# Patient Record
Sex: Male | Born: 1950 | Race: White | Hispanic: No | Marital: Married | Smoking: Former smoker
Health system: Southern US, Community
[De-identification: ages and names within clinical notes are randomized; demographics above are authoritative.]

## PROBLEM LIST (undated history)

## (undated) DIAGNOSIS — I359 Nonrheumatic aortic valve disorder, unspecified: Secondary | ICD-10-CM

## (undated) DIAGNOSIS — R635 Abnormal weight gain: Secondary | ICD-10-CM

## (undated) DIAGNOSIS — R002 Palpitations: Secondary | ICD-10-CM

## (undated) DIAGNOSIS — K5732 Diverticulitis of large intestine without perforation or abscess without bleeding: Secondary | ICD-10-CM

## (undated) DIAGNOSIS — Z0000 Encounter for general adult medical examination without abnormal findings: Secondary | ICD-10-CM

## (undated) DIAGNOSIS — R001 Bradycardia, unspecified: Secondary | ICD-10-CM

## (undated) DIAGNOSIS — C439 Malignant melanoma of skin, unspecified: Secondary | ICD-10-CM

## (undated) DIAGNOSIS — K635 Polyp of colon: Secondary | ICD-10-CM

## (undated) DIAGNOSIS — J310 Chronic rhinitis: Secondary | ICD-10-CM

## (undated) DIAGNOSIS — I099 Rheumatic heart disease, unspecified: Secondary | ICD-10-CM

## (undated) HISTORY — DX: Rheumatic heart disease, unspecified: I09.9

## (undated) HISTORY — PX: COLONOSCOPY: SHX174

## (undated) HISTORY — DX: Diverticulitis of large intestine without perforation or abscess without bleeding: K57.32

## (undated) HISTORY — PX: HERNIA REPAIR: SHX51

## (undated) HISTORY — DX: Abnormal weight gain: R63.5

## (undated) HISTORY — PX: OTHER SURGICAL HISTORY: SHX169

## (undated) HISTORY — PX: TONSILLECTOMY: SUR1361

## (undated) HISTORY — DX: Malignant melanoma of skin, unspecified: C43.9

## (undated) HISTORY — DX: Nonrheumatic aortic valve disorder, unspecified: I35.9

## (undated) HISTORY — DX: Chronic rhinitis: J31.0

## (undated) HISTORY — DX: Polyp of colon: K63.5

## (undated) HISTORY — DX: Palpitations: R00.2

## (undated) HISTORY — DX: Encounter for general adult medical examination without abnormal findings: Z00.00

## (undated) HISTORY — DX: Bradycardia, unspecified: R00.1

---

## 2000-05-30 ENCOUNTER — Other Ambulatory Visit: Admission: RE | Admit: 2000-05-30 | Discharge: 2000-05-30 | Payer: Self-pay | Admitting: Orthopaedic Surgery

## 2002-03-06 ENCOUNTER — Ambulatory Visit (HOSPITAL_BASED_OUTPATIENT_CLINIC_OR_DEPARTMENT_OTHER): Admission: RE | Admit: 2002-03-06 | Discharge: 2002-03-06 | Payer: Self-pay | Admitting: General Surgery

## 2002-03-06 ENCOUNTER — Encounter (INDEPENDENT_AMBULATORY_CARE_PROVIDER_SITE_OTHER): Payer: Self-pay | Admitting: Specialist

## 2004-10-26 ENCOUNTER — Ambulatory Visit: Payer: Self-pay | Admitting: Internal Medicine

## 2006-05-09 ENCOUNTER — Ambulatory Visit: Payer: Self-pay | Admitting: Internal Medicine

## 2006-05-24 ENCOUNTER — Ambulatory Visit: Payer: Self-pay

## 2006-05-24 ENCOUNTER — Encounter: Payer: Self-pay | Admitting: Cardiology

## 2006-06-20 ENCOUNTER — Ambulatory Visit: Payer: Self-pay | Admitting: Internal Medicine

## 2006-08-27 ENCOUNTER — Ambulatory Visit: Payer: Self-pay | Admitting: Internal Medicine

## 2006-09-04 ENCOUNTER — Ambulatory Visit: Payer: Self-pay | Admitting: Internal Medicine

## 2007-04-01 ENCOUNTER — Ambulatory Visit: Payer: Self-pay | Admitting: Internal Medicine

## 2007-06-27 ENCOUNTER — Ambulatory Visit: Payer: Self-pay | Admitting: Internal Medicine

## 2007-06-27 LAB — CONVERTED CEMR LAB
ALT: 25 units/L (ref 0–53)
BUN: 11 mg/dL (ref 6–23)
Bilirubin Urine: NEGATIVE
CO2: 31 meq/L (ref 19–32)
CRP, High Sensitivity: <1 — ABNORMAL LOW (ref 0.00–5.00)
Calcium: 9.2 mg/dL (ref 8.4–10.5)
Creatinine, Ser: 0.8 mg/dL (ref 0.4–1.5)
GFR calc Af Amer: 130 mL/min
Glucose, Bld: 91 mg/dL (ref 70–99)
HCT: 44.9 % (ref 39.0–52.0)
Hemoglobin, Urine: NEGATIVE
Leukocytes, UA: NEGATIVE
Lymphocytes Relative: 22.9 % (ref 12.0–46.0)
MCHC: 34.9 g/dL (ref 30.0–36.0)
MCV: 89 fL (ref 78.0–100.0)
Monocytes Absolute: 0.5 10*3/uL (ref 0.2–0.7)
Monocytes Relative: 7.3 % (ref 3.0–11.0)
Neutro Abs: 4.4 10*3/uL (ref 1.4–7.7)
Nitrite: NEGATIVE
PSA: 1.68 ng/mL (ref 0.10–4.00)
Potassium: 4 meq/L (ref 3.5–5.1)
RDW: 12.6 % (ref 11.5–14.6)
Sodium: 141 meq/L (ref 135–145)
Specific Gravity, Urine: >=1.03 (ref 1.000–1.03)
TSH: 1.39 microintl units/mL (ref 0.35–5.50)
Total Protein, Urine: NEGATIVE mg/dL
Triglycerides: 143 mg/dL (ref 0–149)
Urobilinogen, UA: 0.2 (ref 0.0–1.0)
pH: 6 (ref 5.0–8.0)

## 2007-11-01 DIAGNOSIS — D239 Other benign neoplasm of skin, unspecified: Secondary | ICD-10-CM | POA: Insufficient documentation

## 2007-11-01 DIAGNOSIS — J310 Chronic rhinitis: Secondary | ICD-10-CM

## 2007-11-01 DIAGNOSIS — D126 Benign neoplasm of colon, unspecified: Secondary | ICD-10-CM

## 2007-11-01 DIAGNOSIS — I359 Nonrheumatic aortic valve disorder, unspecified: Secondary | ICD-10-CM

## 2007-11-01 DIAGNOSIS — K5730 Diverticulosis of large intestine without perforation or abscess without bleeding: Secondary | ICD-10-CM | POA: Insufficient documentation

## 2008-04-01 ENCOUNTER — Ambulatory Visit: Payer: Self-pay

## 2008-04-01 ENCOUNTER — Encounter: Payer: Self-pay | Admitting: Internal Medicine

## 2008-04-01 ENCOUNTER — Ambulatory Visit: Payer: Self-pay | Admitting: Internal Medicine

## 2008-08-07 ENCOUNTER — Ambulatory Visit: Payer: Self-pay | Admitting: Internal Medicine

## 2008-08-07 DIAGNOSIS — E7800 Pure hypercholesterolemia, unspecified: Secondary | ICD-10-CM

## 2008-08-07 LAB — CONVERTED CEMR LAB
AST: 19 units/L (ref 0–37)
Albumin: 4.1 g/dL (ref 3.5–5.2)
BUN: 17 mg/dL (ref 6–23)
Basophils Relative: 0.4 % (ref 0.0–3.0)
CRP, High Sensitivity: 1 (ref 0.00–5.00)
Cholesterol: 194 mg/dL (ref 0–200)
Creatinine, Ser: 0.9 mg/dL (ref 0.4–1.5)
Eosinophils Relative: 1 % (ref 0.0–5.0)
GFR calc Af Amer: 113 mL/min
GFR calc non Af Amer: 93 mL/min
Glucose, Bld: 100 mg/dL — ABNORMAL HIGH (ref 70–99)
HDL: 39.9 mg/dL (ref >39.0)
LDL Cholesterol: 136 mg/dL — ABNORMAL HIGH (ref 0–99)
Lymphocytes Relative: 22.2 % (ref 12.0–46.0)
Monocytes Relative: 7.2 % (ref 3.0–12.0)
Neutrophils Relative %: 69.2 % (ref 43.0–77.0)
PSA: 1.51 ng/mL (ref 0.10–4.00)
Platelets: 158 10*3/uL (ref 150–400)
RDW: 12.5 % (ref 11.5–14.6)
Total Bilirubin: 1.2 mg/dL (ref 0.3–1.2)
Triglycerides: 93 mg/dL (ref 0–149)
VLDL: 19 mg/dL (ref 0–40)

## 2008-08-20 ENCOUNTER — Telehealth (INDEPENDENT_AMBULATORY_CARE_PROVIDER_SITE_OTHER): Payer: Self-pay | Admitting: *Deleted

## 2008-09-09 ENCOUNTER — Telehealth (INDEPENDENT_AMBULATORY_CARE_PROVIDER_SITE_OTHER): Payer: Self-pay | Admitting: *Deleted

## 2009-05-04 ENCOUNTER — Ambulatory Visit: Payer: Self-pay | Admitting: Internal Medicine

## 2009-08-09 ENCOUNTER — Ambulatory Visit: Payer: Self-pay | Admitting: Internal Medicine

## 2009-08-09 DIAGNOSIS — R51 Headache: Secondary | ICD-10-CM

## 2009-08-09 DIAGNOSIS — R519 Headache, unspecified: Secondary | ICD-10-CM | POA: Insufficient documentation

## 2009-08-11 LAB — CONVERTED CEMR LAB
ALT: 25 units/L (ref 0–53)
Albumin: 4.2 g/dL (ref 3.5–5.2)
BUN: 16 mg/dL (ref 6–23)
Basophils Relative: 0.8 % (ref 0.0–3.0)
CRP, High Sensitivity: 4.3 (ref 0.00–5.00)
Cholesterol: 224 mg/dL — ABNORMAL HIGH (ref 0–200)
Creatinine, Ser: 1 mg/dL (ref 0.4–1.5)
GFR calc non Af Amer: 81.93 mL/min (ref >60)
HDL: 44.4 mg/dL (ref >39.00)
Leukocytes, UA: NEGATIVE
Lymphs Abs: 1.5 10*3/uL (ref 0.7–4.0)
MCHC: 35.2 g/dL (ref 30.0–36.0)
MCV: 91.2 fL (ref 78.0–100.0)
Monocytes Absolute: 0.4 10*3/uL (ref 0.1–1.0)
Monocytes Relative: 5.9 % (ref 3.0–12.0)
Neutrophils Relative %: 70.7 % (ref 43.0–77.0)
PSA: 1.82 ng/mL (ref 0.10–4.00)
Potassium: 4.2 meq/L (ref 3.5–5.1)
RBC: 5.23 M/uL (ref 4.22–5.81)
Sodium: 143 meq/L (ref 135–145)
Specific Gravity, Urine: 1.025 (ref 1.000–1.030)
Total Bilirubin: 1.5 mg/dL — ABNORMAL HIGH (ref 0.3–1.2)
Total Protein, Urine: NEGATIVE mg/dL
Total Protein: 7.3 g/dL (ref 6.0–8.3)
Triglycerides: 137 mg/dL (ref 0.0–149.0)
Urobilinogen, UA: 0.2 (ref 0.0–1.0)
WBC: 7 10*3/uL (ref 4.5–10.5)
pH: 5.5 (ref 5.0–8.0)

## 2009-08-13 ENCOUNTER — Telehealth: Payer: Self-pay | Admitting: Internal Medicine

## 2009-11-05 ENCOUNTER — Ambulatory Visit: Payer: Self-pay | Admitting: Internal Medicine

## 2009-11-18 ENCOUNTER — Ambulatory Visit: Payer: Self-pay | Admitting: Internal Medicine

## 2009-11-18 DIAGNOSIS — R05 Cough: Secondary | ICD-10-CM

## 2010-01-17 ENCOUNTER — Encounter: Payer: Self-pay | Admitting: Internal Medicine

## 2010-11-03 NOTE — Assessment & Plan Note (Signed)
Summary: Primary svc/ acute uri   Primary Provider/Referring Provider:  Sherene Sires  CC:  Acute visit.  Pt c/o dry ocugh x 3 days.  Also he c/o chills and achiness.  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  no new neuro co's or n or v.   better with advil  November 05, 2009 acute ov Flew to [facility] Jan 30 and back early am on 2/1 with acute onset aching all over and painful cough then chills better with advil,  this am nasal slt yellow and sore throat this am.  no sob.  Pt denies any significant sore throat, dysphagia, itching, sneezing,  nasal congestion or excess secretions,  fever, chills, sweats, unintended wt loss, lateralizing  pleuritic or exertional cp, hempoptysis, change in activity tolerance  orthopnea pnd or leg swelling   Current Medications (verified): 1)  Flonase 50 Mcg/act  Susp (Fluticasone Propionate) .... Take 1 Spray in Each Nostirl Two Times A Day 2)  Multivitamins   Tabs (Multiple Vitamin) .... Once Daily 3)  Adult Aspirin Low Strength 81 Mg  Tbdp (Aspirin) .... Once Daily 4)  Fish Oil Concentrate 1000 Mg  Caps (Omega-3 Fatty Acids) .... Four Times A Day 5)  Viagra 50 Mg  Tabs (Sildenafil Citrate) .... One Daily As Needed  Allergies (verified): No Known Drug Allergies  Past History:  Past Medical History: RHINITIS, CHRONIC (ICD-472.0) Hx of DIVERTICULITIS, COLON (ICD-562.11)............................Leon Gibson Medoff Hx of POLYP, COLON (ICD-211.3)        - Colonoscopy  8/05 AORTIC INSUFFICIENCY (ICD-424.1)........................................Leon Gibson     - Echo 04/01/08  Melanoma..................................................................................Leon Gibson      -  L  paralumbar 2003, no adjuvant rx Weight Gain       -   ideal < 191  Target wt  =  219  for BMI < 30  HEALTH MAINTENANCE..............................................................Leon Gibson   - Td August 07, 2008    -  CPX  August 07, 2008   History of rheumatic heart disease  Vital Signs:  Patient profile:   60 year old male Weight:      208 pounds O2 Sat:      100 % on Room air Temp:     97.6 degrees F oral Pulse rate:   50 / minute BP sitting:   140 / 58  (left arm)  Vitals Entered By: Vernie Murders (November 05, 2009 1:56 PM)  O2 Flow:  Room air  Physical Exam  Additional Exam:  wt 201 August 07, 2008 > 204 August 11, 2009 > 208 November 05, 2009  Ambulatory healthy appearing wm in no acute distress. HEENT: nl dentition, turbinates, and orophanx. Nl external ear canals without cough reflex Neck without JVD/Nodes/TM Lungs clear to A and P bilaterally without cough on insp or exp maneuvers RRR II/VI  diastolic m Abd soft and benign with nl excursion in the supine position. No bruits or organomegaly Ext warm without calf tenderness, cyanosis clubbing or edema Skin warm and dry with multiple slt aytypical nevi over trunk    Impression & Recommendations:  Problem # 1:  RHINITIS, CHRONIC (ICD-472.0)  Acute flare with tracheobronchitis, prob uri related to air  travel  See instructions for specific recommendations   Explained natural h/o uri and why it's necessary in patients at risk to rx short term with PPI to reduce risk of evolving cyclical cough triggered by epithelial injury and a heightened sensitivty to the effects of any upper airway irritants,  most importantly acid - related    Orders: Est. Patient Level III ([address])  Medications Added to Medication List This Visit: 1)  Prednisone 10 Mg Tabs (Prednisone) .... 4 each am x 2days, 2x2days, 1x2days and stop 2)  Doxycycline Hyclate 100 Mg Caps (Doxycycline hyclate) .... One twice daily for 7 days taken before  meals with a glass of water  Patient Instructions: 1)  GERD (REFLUX)  is a common cause of respiratory symptoms. It commonly presents without heartburn and can be treated with medication, but also with lifestyle changes including avoidance of late meals, excessive alcohol, smoking cessation, and avoid fatty foods, chocolate, peppermint, colas, red wine, and acidic juices such as orange juice. NO MINT OR MENTHOL PRODUCTS SO NO COUGH DROPS  2)  USE SUGARLESS CANDY INSTEAD (jolley ranchers)  3)  NO OIL BASED VITAMINS  4)  Prilosec otc 20mg  Take  one 30-60 min before first meal of the day and pecid 20 mg one at bedtime as long as cough or sore throat 5)  Doxycyline 100mg  one twice daily  6)  Prednisone 10 mg 4 each am x 2days, 2x2days, 1x2days and stop  Prescriptions: DOXYCYCLINE HYCLATE 100 MG CAPS (DOXYCYCLINE HYCLATE) one twice daily for 7 days taken before meals with a glass of water  #14 x 0   Entered and Authorized by:   Nyoka Cowden MD   Signed by:   Nyoka Cowden MD on 11/05/2009   Method used:   Electronically to        [facility] (retail)       [address]       [place], [place]  [address]       Ph: [phone] or [phone]       Fax: [phone]   RxID:   [id number] PREDNISONE 10 MG  TABS (PREDNISONE) 4 each am x 2days, 2x2days, 1x2days and stop  #14 x 0   Entered and Authorized by:   Nyoka Cowden MD   Signed by:   Nyoka Cowden MD on 11/05/2009   Method used:   Electronically to        [facility] (retail)       [address]       [place], [place]  [id number]       Ph: [phone] or [phone]       Fax: [phone]   RxID:   [id number]

## 2010-11-03 NOTE — Assessment & Plan Note (Signed)
Summary: Primary svc/ eval persistent cough   Primary Provider/Referring Provider:  Sherene Sires  CC:  Acute visit.  Pt c/o increased cough x 2 days.  Cough is non prod.  He also c/o increased nasal congestion x 2 days.Marland Kitchen  History of Present Illness: 69  yowm minimal smoking as teenager  with AI known since 1999 followed by [name] with h/o palpitations with coffee  August 07, 2008 cpx ex = tennis, walking, no sob, cp, palpitations, tia or claudication symptoms  August 09, 2009  cpx,  occ ha with scotoma all his life, somewhat worse lately with stree re daughter's depression.  no new neuro co's or n or v.   better with advil  November 05, 2009 acute ov airplane travel  to [facility] Jan 30 and back early am on 2/2  with acute onset aching all over same day as returned  and painful cough then chills better with advil,  this am nasal slt yellow and sore throat this am.   rec Prilosec otc 20mg  Take  one 30-60 min before first meal of the day and pecid 20 mg one at bedtime as long as cough or sore throat Doxycyline 100mg  one twice daily  Prednisone 10 mg 4 each am x 2days, 2x2days, 1x2days and stop > almost completely then relapsed 2/16 with bad cough  November 18, 2009 Acute visit.  Pt c/o increased cough x 2 days.  Cough is non prod.  He also c/o increased nasal congestion x 2 days. Minimal white mucus.  not consistent with gerd rx. Pt denies any significant sore throat, dysphagia, itching, sneezing, excess nasal secretions,  fever, chills, sweats, unintended wt loss, pleuritic or exertional cp, hempoptysis, change in activity tolerance  orthopnea pnd or leg swelling.  Current Medications (verified): 1)  Flonase 50 Mcg/act  Susp (Fluticasone Propionate) .... Take 1 Spray in Each Nostirl Two Times A Day 2)  Multivitamins   Tabs (Multiple Vitamin) .... Once Daily 3)  Adult Aspirin Low Strength 81 Mg  Tbdp (Aspirin) .... Once Daily 4)  Fish Oil Concentrate 1000 Mg  Caps (Omega-3 Fatty Acids) .... Four  Times A Day 5)  Viagra 50 Mg  Tabs (Sildenafil Citrate) .... One Daily As Needed  Allergies (verified): No Known Drug Allergies  Past History:  Past Medical History: RHINITIS, CHRONIC (ICD-472.0) Hx of DIVERTICULITIS, COLON (ICD-562.11)............................Marland Kitchen Medoff Hx of POLYP, COLON (ICD-211.3)        - Colonoscopy  8/05 AORTIC INSUFFICIENCY (ICD-424.1)........................................Marland KitchenLadona Ridgel     - Echo 04/01/08  Melanoma..................................................................................Marland KitchenLomax      -  L paralumbar 2003, no adjuvant rx Weight Gain       -   ideal < 191  Target wt  =  219  for BMI < 30  HEALTH MAINTENANCE................................................................Marland KitchenWert   - Td August 07, 2008    -  CPX  August 07, 2008   History of rheumatic heart disease  Vital Signs:  Patient profile:   60 year old male Weight:      203 pounds O2 Sat:      97 % on Room air Temp:     97.9 degrees F oral Pulse rate:   82 / minute BP sitting:   130 / 64  (left arm)  Vitals Entered By: Vernie Murders (November 18, 2009 10:14 AM)  O2 Flow:  Room air  Physical Exam  Additional Exam:  wt 201 August 07, 2008 > 204 August 11, 2009 > 208 November 05, 2009 > 203 November 18, 2009  Ambulatory healthy appearing wm in no acute distress with frequent throat clearing HEENT: nl dentition, turbinates, and orophanx. Nl external ear canals without cough reflex Neck without JVD/Nodes/TM Lungs clear to A and P bilaterally without cough on insp or exp maneuvers RRR II/VI  diastolic m Abd soft and benign with nl excursion in the supine position. No bruits or organomegaly Ext warm without calf tenderness, cyanosis clubbing or edema     CXR  Procedure date:  11/18/2009  Findings:      Comparison: Chest 10/26/2004.   Findings: The lungs are clear.  No pleural effusion.  Heart size normal.   IMPRESSION: No Acute disease.  Stable compared to  prior exam.  Impression & Recommendations:  Problem # 1:  COUGH (ICD-786.2) Of the three most common causes of chronic cough, only one GERD  can actually cause the other two and perpetuate the cylce of cough inducing airway trauma, inflammation, heightened sensitivity to reflux which is prompted by the cough itself via a cyclical mechanism.  This may partially respond to steroids and look like asthma and post nasal drainage but never erradicated completely unless the cough and the secondary reflux are eliminated, preferably both at the same time.   Rec See instructions for specific recommendations   Problem # 2:  RHINITIS, CHRONIC (ICD-472.0) I emphasized that nasal steroids have no immediate benefit in terms of improving symptoms.  To help them reached the target tissue, the patient should use Afrin two puffs every 12 hours applied one min before using the nasal steroids.  Afrin should be stopped after no more than 5 days.  If the symptoms worsen, Afrin can be restarted after 5 days off of therapy to prevent rebound congestion from overuse of Afrin.  I also emphasized that in no way are nasal steroids a concern in terms of "addiction".    Medications Added to Medication List This Visit: 1)  Prednisone 10 Mg Tabs (Prednisone) .... 4 each am x 2days, 2x2days, 1x2days and stop 2)  Tramadol Hcl 50 Mg Tabs (Tramadol hcl) .... One to two by mouth every 4-6 hours for cough or pain  Other Orders: T-2 View CXR (71020TC) Est. Patient Level III (54098)  Patient Instructions: 1)  GERD (REFLUX)  is a common cause of respiratory symptoms. It commonly presents without heartburn and can be treated with medication, but also with lifestyle changes including avoidance of late meals, excessive alcohol, smoking cessation, and avoid fatty foods, chocolate, peppermint, colas, red wine, and acidic juices such as orange juice. NO MINT OR MENTHOL PRODUCTS SO NO COUGH DROPS  2)  USE SUGARLESS CANDY INSTEAD (jolley  ranchers)  3)  NO OIL BASED VITAMINS  4)  Prilosec otc 20mg  Take  one 30-60 min before first meal of the day and pecid 20 mg one at bedtime as long as cough or sore throat 5)  Prednisone 10 mg 4 each am x 2days, 2x2days, 1x2days and stop 6)  Take delsym two tsp every 12 hours and add tramadol 50 mg up to every 4 hours to suppress the urge to cough. Swallowing water or using ice chips/non mint and menthol containing candies (such as lifesavers or sugarless jolly ranchers) are also effective.  7)  [place] Chest 2008 supplement  Cough guidlines  8)  Upper airway cough syndrome, so named because it's frequently impossible to sort out how much is LPR vs CR/sinusitis with freq throat clearing generating secondary extra esophageal GERD  from wide swings in gastric pressure that occur with throat clearing, promoting self use of mint and menthol lozenges that reduce the lower esophageal sphincter tone and exacerbate the problem further.  These symptoms are easily confused with asthma/copd by even experienced pulmonogists because they overlap so much. These are the same pts who not infrequently have failed to tolerate ace inhibitors,  dry powder inhalers or biphosphonates or report having reflux symptoms that don't respond to standard doses of prilosec  Prescriptions: TRAMADOL HCL 50 MG  TABS (TRAMADOL HCL) One to two by mouth every 4-6 hours for cough or pain  #40 x 0   Entered and Authorized by:   Nyoka Cowden MD   Signed by:   Nyoka Cowden MD on 11/18/2009   Method used:   Electronically to        [facility] (retail)       [address]       [place], [place]  [id number]       Ph: [phone] or [phone]       Fax: [phone]   RxID:   [id number] PREDNISONE 10 MG  TABS (PREDNISONE) 4 each am x 2days, 2x2days, 1x2days and stop  #14 x 0   Entered and Authorized by:   Nyoka Cowden MD   Signed by:   Nyoka Cowden MD on 11/18/2009   Method used:   Electronically to         [facility] (retail)       [address]       [place], [place]  [id number]       Ph: [phone] or [phone]       Fax: [phone]   RxID:   [id number]

## 2010-11-03 NOTE — Procedures (Signed)
Summary: [name] MD  [name] MD   Imported By: Lester Lennox 01/25/2010 07:52:30  _____________________________________________________________________  External Attachment:    Type:   Image     Comment:   External Document

## 2010-12-09 ENCOUNTER — Encounter: Payer: Self-pay | Admitting: Internal Medicine

## 2010-12-09 ENCOUNTER — Other Ambulatory Visit: Payer: Self-pay | Admitting: Internal Medicine

## 2010-12-09 ENCOUNTER — Ambulatory Visit (INDEPENDENT_AMBULATORY_CARE_PROVIDER_SITE_OTHER)
Admission: RE | Admit: 2010-12-09 | Discharge: 2010-12-09 | Disposition: A | Discharge status: Home / Self Care | Payer: BC Managed Care – PPO | Source: Ambulatory Visit | Attending: Internal Medicine | Admitting: Internal Medicine

## 2010-12-09 ENCOUNTER — Other Ambulatory Visit: Payer: BC Managed Care – PPO

## 2010-12-09 ENCOUNTER — Encounter (INDEPENDENT_AMBULATORY_CARE_PROVIDER_SITE_OTHER): Payer: BC Managed Care – PPO | Admitting: Internal Medicine

## 2010-12-09 DIAGNOSIS — Z0000 Encounter for general adult medical examination without abnormal findings: Secondary | ICD-10-CM

## 2010-12-09 DIAGNOSIS — E785 Hyperlipidemia, unspecified: Secondary | ICD-10-CM

## 2010-12-09 LAB — LDL CHOLESTEROL, DIRECT: Direct LDL: 142.8 mg/dL

## 2010-12-09 LAB — CBC WITH DIFFERENTIAL/PLATELET
Eosinophils Relative: 1 % (ref 0.0–5.0)
Lymphocytes Relative: 21.1 % (ref 12.0–46.0)
MCV: 91 fl (ref 78.0–100.0)
Monocytes Absolute: 0.5 10*3/uL (ref 0.1–1.0)
Monocytes Relative: 7.4 % (ref 3.0–12.0)
Neutrophils Relative %: 69.6 % (ref 43.0–77.0)
Platelets: 148 10*3/uL — ABNORMAL LOW (ref 150.0–400.0)
RBC: 5.02 Mil/uL (ref 4.22–5.81)
WBC: 6.9 10*3/uL (ref 4.5–10.5)

## 2010-12-09 LAB — LIPID PANEL
Cholesterol: 218 mg/dL — ABNORMAL HIGH (ref 0–200)
Total CHOL/HDL Ratio: 5
Triglycerides: 139 mg/dL (ref 0.0–149.0)
VLDL: 27.8 mg/dL (ref 0.0–40.0)

## 2010-12-09 LAB — URINALYSIS
Ketones, ur: NEGATIVE
Leukocytes, UA: NEGATIVE
Nitrite: NEGATIVE
Specific Gravity, Urine: 1.02 (ref 1.000–1.030)
Total Protein, Urine: NEGATIVE
pH: 7 (ref 5.0–8.0)

## 2010-12-09 LAB — BASIC METABOLIC PANEL
BUN: 17 mg/dL (ref 6–23)
Calcium: 9.3 mg/dL (ref 8.4–10.5)
Creatinine, Ser: 0.9 mg/dL (ref 0.4–1.5)
GFR: 97.04 mL/min (ref >60.00)

## 2010-12-09 LAB — HEPATIC FUNCTION PANEL
ALT: 24 U/L (ref 0–53)
Alkaline Phosphatase: 57 U/L (ref 39–117)
Bilirubin, Direct: 0.2 mg/dL (ref 0.0–0.3)
Total Bilirubin: 1.1 mg/dL (ref 0.3–1.2)

## 2010-12-09 LAB — HIGH SENSITIVITY CRP: CRP, High Sensitivity: 1.45 mg/L (ref 0.00–5.00)

## 2010-12-13 NOTE — Assessment & Plan Note (Signed)
Summary: Primary svc/ cpx   Primary Provider/Referring Provider:  Sherene Sires  CC:  cpx fasting.  History of Present Illness: 68  yowm minimal smoking as teenager  with AI known since 1999 followed by [name] with h/o palpitations with coffee  August 07, 2008 cpx ex = tennis, walking, no sob, cp, palpitations, tia or claudication symptoms  August 09, 2009  cpx,  occ ha with scotoma all his life, somewhat worse lately with stree re daughter's depression.  no new neuro co's or n or v.   better with advil  November 05, 2009 acute ov airplane travel  to [facility] Jan 30 and back early am on 2/2  with acute onset aching all over same day as returned  and painful cough then chills better with advil,  this am nasal slt yellow and sore throat this am.   rec Prilosec otc 20mg  Take  one 30-60 min before first meal of the day and pecid 20 mg one at bedtime as long as cough or sore throat Doxycyline 100mg  one twice daily  Prednisone 10 mg 4 each am x 2days, 2x2days, 1x2days and stop > almost completely then relapsed 2/16 with bad cough  November 18, 2009 Acute visit.  Pt c/o increased cough x 2 days.  Cough is non prod.  He also c/o increased nasal congestion x 2 days. Minimal white mucus.  not consistent with gerd rx.  December 09, 2010 cpx ex more than baseline = tennis sev times a week,  including up to 1.5 hours straight intense no sob, presyncope, no palpitations, cp.    Current Medications (verified): 1)  Flonase 50 Mcg/act  Susp (Fluticasone Propionate) .... Take 1 Spray in Each Nostirl Two Times A Day 2)  Multivitamins   Tabs (Multiple Vitamin) .... Once Daily 3)  Adult Aspirin Low Strength 81 Mg  Tbdp (Aspirin) .... Once Daily 4)  Fish Oil Concentrate 1000 Mg  Caps (Omega-3 Fatty Acids) .... Four Times A Day 5)  Viagra 50 Mg  Tabs (Sildenafil Citrate) .... One Daily As Needed  Allergies (verified): No Known Drug Allergies  Past History:  Past Medical History: RHINITIS, CHRONIC  (ICD-472.0) Hx of DIVERTICULITIS, COLON (ICD-562.11)............................Marland Kitchen Medoff Hx of POLYP, COLON (ICD-211.3)        - Colonoscopy  8/05,  12/2009 AORTIC INSUFFICIENCY (ICD-424.1)........................................Marland KitchenLadona Ridgel     - Echo 04/01/08  Melanoma..................................................................................Marland KitchenLomax      -  L paralumbar 2003, no adjuvant rx Weight Gain       -   ideal < 191  Target wt  =  219  for BMI < 30  HEALTH MAINTENANCE................................................................Marland KitchenWert   - Td August 07, 2008    -  CPX December 09, 2010    History of rheumatic heart disease  Family History: MM in father colon polyps mother sudden death older brother no clear dx but mo/osa depression broth  Social History: Reviewed history from 08/07/2008 and no changes required. Former smoker, teenager only ETOH occ Attorney  Vital Signs:  Patient profile:   60 year old male Weight:      198 pounds BMI:     28.51 O2 Sat:      97 % on Room air Temp:     98.0 degrees F oral Pulse rate:   60 / minute BP sitting:   122 / 70  (left arm)  Vitals Entered By: Vernie Murders (December 09, 2010 8:52 AM)  O2 Flow:  Room air  Physical  Exam  Additional Exam:  wt 201 August 07, 2008 > 204 August 11, 2009 > 208 November 05, 2009 > 203 November 18, 2009 >  198 December 09, 2010  Ambulatory healthy appearing wm in no acute distress with frequent throat clearing HEENT: nl dentition, turbinates, and orophanx. Nl external ear canals without cough reflex Neck without JVD/Nodes/TM Lungs clear to A and P bilaterally without cough on insp or exp maneuvers RRR II/VI  diastolic m Abd soft and benign with nl excursion in the supine position. No bruits or organomegaly Ext warm without calf tenderness, cyanosis clubbing or edema GU  testes down bilaterally, neg IH Rectal very mild bph,  stool g neg MS  nl gait, no joint restrictions   Cholesterol           [H]  218 mg/dL                   6-045     ATP III Classification            Desirable:  < 200 mg/dL                    Borderline High:  200 - 239 mg/dL               High:  > = 240 mg/dL   Triglycerides             139.0 mg/dL                 4.0-981.1     Normal:  <150 mg/dL     Borderline High:  914 - 199 mg/dL   HDL                       78.29 mg/dL                 >56.21   VLDL Cholesterol          27.8 mg/dL                  3.0-86.5  CHO/HDL Ratio:  CHD Risk                             5                    Men          Women     1/2 Average Risk     3.4          3.3     Average Risk          5.0          4.4     2X Average Risk          9.6          7.1     3X Average Risk          15.0          11.0                           Tests: (2) BMP (METABOL)   Sodium                    139 mEq/L  135-145   Potassium                 4.4 mEq/L                   3.5-5.1   Chloride                  102 mEq/L                   96-112   Carbon Dioxide       [H]  33 mEq/L                    19-32   Glucose                   92 mg/dL                    78-29   BUN                       17 mg/dL                    5-62   Creatinine                0.9 mg/dL                   1.3-0.8   Calcium                   9.3 mg/dL                   6.5-78.4   GFR                       97.04 mL/min                >60.00  Tests: (3) CBC Platelet w/Diff (CBCD)   White Cell Count          6.9 K/uL                    4.5-10.5   Red Cell Count            5.02 Mil/uL                 4.22-5.81   Hemoglobin                15.9 g/dL                   69.6-29.5   Hematocrit                45.7 %                      39.0-52.0   MCV                       91.0 fl                     78.0-100.0   MCHC                      34.9 g/dL                   28.4-13.2   RDW  13.1 %                      11.5-14.6   Platelet Count       [L]  148.0 K/uL                  150.0-400.0    Neutrophil %              69.6 %                      43.0-77.0   Lymphocyte %              21.1 %                      12.0-46.0   Monocyte %                7.4 %                       3.0-12.0   Eosinophils%              1.0 %                       0.0-5.0   Basophils %               0.9 %                       0.0-3.0   Neutrophill Absolute      4.8 K/uL                    1.4-7.7   Lymphocyte Absolute       1.5 K/uL                    0.7-4.0   Monocyte Absolute         0.5 K/uL                    0.1-1.0  Eosinophils, Absolute                             0.1 K/uL                    0.0-0.7   Basophils Absolute        0.1 K/uL                    0.0-0.1  Tests: (4) Hepatic/Liver Function Panel (HEPATIC)   Total Bilirubin           1.1 mg/dL                   0.4-5.4   Direct Bilirubin          0.2 mg/dL                   0.9-8.1   Alkaline Phosphatase      57 U/L                      39-117   AST                       23 U/L  0-37   ALT                       24 U/L                      0-53   Total Protein             6.6 g/dL                    1.6-1.0   Albumin                   4.1 g/dL                    9.6-0.4  Tests: (5) TSH (TSH)   FastTSH                   1.01 uIU/mL                 0.35-5.50  Tests: (6) Full Range CRP (FCRP)   CRPH                      1.45 mg/L                   0.00-5.00     Note:  An elevated hs-CRP (>5 mg/L) should be repeated after 2 weeks to rule out recent infection or trauma.  Tests: (7) UDip Only (UDIP)   Color                     YELLOW       RANGE:  Yellow;Lt. Yellow   Clarity                   CLEAR                       Clear   Specific Gravity          1.020                       1.000 - 1.030   Urine Ph                  7.0                         5.0-8.0   Protein                   NEGATIVE                    Negative   Urine Glucose             NEGATIVE                    Negative   Ketones                    NEGATIVE                    Negative   Urine Bilirubin           NEGATIVE                    Negative   Blood  NEGATIVE                    Negative   Urobilinogen              0.2                         0.0 - 1.0   Leukocyte Esterace        NEGATIVE                    Negative   Nitrite                   NEGATIVE                    Negative  Tests: (8) Prostate Specific Antigen (PSA)   PSA-Hyb                   2.09 ng/mL                  0.10-4.00  Tests: (9) Cholesterol LDL - Direct (DIRLDL)  Cholesterol LDL - Direct                             142.8 mg/dL  CXR  Procedure date:  12/09/2010  Findings:       Comparison: Plain films of the chest 11/18/2009 and 10/26/2004.  Findings: The lungs are clear.  No pneumothorax or pleural effusion.  Heart size normal.  No focal bony abnormality.  IMPRESSION: Negative chest.  Impression & Recommendations:  Problem # 1:  HYPERCHOLESTEROLEMIA (ICD-272.0) Target  < 130 LDL Labs Reviewed: SGOT: 26 (08/09/2009)   SGPT: 25 (08/09/2009)   HDL:44.40 (08/09/2009), 39.9 (08/07/2008)  LDL:136 (08/07/2008), 122 (06/27/2007) > 143  December 09, 2010    Chol:224 (08/09/2009), 194 (08/07/2008)  Trig:137.0 (08/09/2009), 93 (08/07/2008)  Problem # 2:  AORTIC INSUFFICIENCY (ICD-424.1)  His updated medication list for this problem includes:    Adult Aspirin Low Strength 81 Mg Tbdp (Aspirin) ..... Once daily  f/u Dr Ladona Ridgel  Problem # 3:  RHINITIS, CHRONIC (ICD-472.0) continue flonase  Medications Added to Medication List This Visit: 1)  Viagra 50 Mg Tabs (Sildenafil citrate) .... One daily as needed  Other Orders: EKG w/ Interpretation (93000) T-2 View CXR (71020TC) TLB-Lipid Panel (80061-LIPID) TLB-BMP (Basic Metabolic Panel-BMET) (80048-METABOL) TLB-CBC Platelet - w/Differential (85025-CBCD) TLB-Hepatic/Liver Function Pnl (80076-HEPATIC) TLB-TSH (Thyroid Stimulating Hormone) (84443-TSH) TLB-CRP-High Sensitivity  (C-Reactive Protein) (86140-FCRP) TLB-Udip ONLY (81003-UDIP) TLB-PSA (Prostate Specific Antigen) (84153-PSA) Est. Patient 40-64 years (16109)  Patient Instructions: 1)  Call [phone] for your results w/in next 3 days - if there's something important  I feel you need to know,  I'll be in touch with you directly.  Prescriptions: VIAGRA 50 MG  TABS (SILDENAFIL CITRATE) one daily as needed  #10 x 11   Entered and Authorized by:   Nyoka Cowden MD   Signed by:   Nyoka Cowden MD on 12/09/2010   Method used:   Electronically to        [facility] (retail)       [address]       [place], [place]  [id number]       Ph: [phone] or [phone]       Fax: [phone]   RxID:   [id number] FLONASE 50 MCG/ACT  SUSP (FLUTICASONE PROPIONATE) Take 1  spray in each nostirl two times a day  #1 x 11   Entered and Authorized by:   Nyoka Cowden MD   Signed by:   Nyoka Cowden MD on 12/09/2010   Method used:   Electronically to        [facility] (retail)       [address]       [place], [place]  [id number]       Ph: [phone] or [phone]       Fax: [phone]   RxID:   [id number]

## 2011-02-01 ENCOUNTER — Encounter: Payer: Self-pay | Admitting: Internal Medicine

## 2011-02-02 ENCOUNTER — Ambulatory Visit (INDEPENDENT_AMBULATORY_CARE_PROVIDER_SITE_OTHER): Payer: BC Managed Care – PPO | Admitting: Internal Medicine

## 2011-02-02 ENCOUNTER — Encounter: Payer: Self-pay | Admitting: Internal Medicine

## 2011-02-02 VITALS — BP 145/67 | HR 60 | Resp 14 | Ht 72.0 in | Wt 198.0 lb

## 2011-02-02 DIAGNOSIS — I359 Nonrheumatic aortic valve disorder, unspecified: Secondary | ICD-10-CM

## 2011-02-02 DIAGNOSIS — I351 Nonrheumatic aortic (valve) insufficiency: Secondary | ICD-10-CM

## 2011-02-02 NOTE — Assessment & Plan Note (Addendum)
The patient's murmur of aortic stenosis is fairly classic. His pulse pressure is only minimally elevated and his pulses are fairly normal. His last 2-D echo demonstrated moderate aortic insufficiency. I've recommended that he repeat his echo in the next several months. We'll also be on the lookout for aortic root enlargement. He is currently asymptomatic. While considering afterload reduction, because there is little data supporting these medications, I will hold off on adding additional medical therapy. If his blood pressure appears to remain high, then an ACE inhibitor or an ARB drug would be a consideration.

## 2011-02-02 NOTE — Progress Notes (Signed)
HPI Leon Gibson returns today for followup. He is a pleasant middle-aged man with aortic insufficiency, a family history of sudden cardiac death, and borderline hypertension. The patient has done quite well over the last year. He denies chest pain, shortness of breath, peripheral edema, or syncope. He exercises regularly and is asymptomatic. Not on File   Current Outpatient Prescriptions  Medication Sig Dispense Refill  . aspirin 81 MG tablet Take 81 mg by mouth daily.        . fish oil-omega-3 fatty acids 1000 MG capsule Take 2 g by mouth 4 (four) times daily.        . fluticasone (FLONASE) 50 MCG/ACT nasal spray 1 spray by Nasal route 2 (two) times daily.        . Multiple Vitamin (MULTIVITAMIN) capsule Take 1 capsule by mouth daily.        . sildenafil (VIAGRA) 50 MG tablet Take 50 mg by mouth daily as needed.           Past Medical History  Diagnosis Date  . Chronic rhinitis   . Diverticulitis of colon (without mention of hemorrhage)   . Aortic valve disorders     ROS:   All systems reviewed and negative except as noted in the HPI.   Past Surgical History  Procedure Date  . Melonoma removal     one level 2 melanoma with a single level 2 melanoma excised from his low back in aMay of 2003     No family history on file.   History   Social History  . Marital Status: Married    Spouse Name: N/A    Number of Children: N/A  . Years of Education: N/A   Occupational History  . Not on file.   Social History Main Topics  . Smoking status: Former Games developer  . Smokeless tobacco: Not on file  . Alcohol Use: Not on file  . Drug Use: Not on file  . Sexually Active: Not on file   Other Topics Concern  . Not on file   Social History Narrative  . No narrative on file     BP 145/67  Pulse 60  Resp 14  Ht 6' (1.829 m)  Wt 198 lb (89.812 kg)  BMI 26.85 kg/m2  Physical Exam:  Well appearing NAD HEENT: Unremarkable Neck:  No JVD, no thyromegally Lymphatics:  No  adenopathy Back:  No CVA tenderness Lungs:  Clear HEART:  Regular rate rhythm, with a grade 1/6 systolic murmur at the right upper sternal border and a grade 3/6 diastolic murmur at the right upper sternal border.  Abd:  Flat, positive bowel sounds, no organomegally, no rebound, no guarding Ext:  2 plus pulses, no edema, no cyanosis, no clubbing Skin:  No rashes no nodules Neuro:  CN II through XII intact, motor grossly intact  Assess/Plan:

## 2011-02-02 NOTE — Patient Instructions (Signed)
Your physician wants you to follow-up in: 1 year with Dr Court Joy will receive a reminder letter in the mail two months in advance. If you don't receive a letter, please call our office to schedule the follow-up appointment.  Your physician has requested that you have an echocardiogram. Echocardiography is a painless test that uses sound waves to create images of your heart. It provides your doctor with information about the size and shape of your heart and how well your heart's chambers and valves are working. This procedure takes approximately one hour. There are no restrictions for this procedure. June or July for Aortic Insuff

## 2011-02-14 NOTE — Assessment & Plan Note (Signed)
[facility]                         ELECTROPHYSIOLOGY OFFICE NOTE   MACARIUS, RUARK                     MRN:          [id number]  DATE:04/01/2007                            DOB:          07/06/1951    Mr. Witman returns today for followup.  He is a very pleasant middle-  aged man with history of aortic insufficiency, palpitations secondary to  PVCs, with a strong family history of sudden cardiac death.  The patient  returns today for followup.  In the interim, since I saw him last, his  palpitations have much improved.  He denies chest pain or shortness of  breath.  He has had no dizzy spells.  He denies peripheral edema.  On  physical exam, he is a pleasant, well-appearing middle-aged man in no  distress.  The blood pressure today was 134/72, the pulse 57 and  regular, the respirations were 18, and the weight was 198 pounds.  The  neck revealed no jugular venous distention.  Lungs clear bilaterally to  auscultation.  No wheezes, rales, or rhonchi were present.  Cardiovascular exam reveals a regular rate and rhythm with a soft  (1/6)diastolic murmur consistent with AI.  Extremities demonstrated no  edema.   IMPRESSION:  1. Aortic insufficiency.  2. History of palpitations and documented PVCs in the setting of a      family history of sudden death.   DISCUSSION:  Overall, Mr. Schauer is stable.  He is scheduled to see Dr.  Sherene Sires and I will obtain labs today including fasting lipids as well as  CBC and BMP and liver panel.  I also will check a PSA and he will follow  up with Dr. Sherene Sires.  I will see him back in a year.     Doylene Canning. Ladona Ridgel, MD  Electronically Signed    GWT/MedQ  DD: 04/01/2007  DT: 04/01/2007  Job #: [id number]

## 2011-02-14 NOTE — Assessment & Plan Note (Signed)
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  He admits he has not been particularly active physically but denies any  exertional chest pain, orthopnea, PND, or leg swelling, TIA or  claudication symptoms.   PAST MEDICAL HISTORY:  1. Aortic insufficiency.  See most recent echocardiogram dated May 28, 2006, associated with symptomatic PVCs and followed by Dr.      Lewayne Bunting, therefore.  He had a negative exercise stress test      May 24, 2006.  2. Multiple atypical nevi with a level 2 melanoma excised from the low      back May 2003.  3. History of colon polyps and diverticulosis, followed by Dr. Kinnie Scales,      up for recall 2010.  4. Chronic rhinitis responsive to Flonase.   ALLERGIES:  None known.   MEDICATIONS:  1. Baby aspirin 1 daily.  2. Multivites 1 daily.  3. Flonase daily.  4. Fish oil 4 tabs daily per Dr. Lubertha Basque recommendation.   SOCIAL HISTORY:  He quit smoking 35 years ago.  He works as an Pensions consultant  and says life is not quite as stressful as it has been in this past  year.   FAMILY HISTORY:  Positive for multiple myeloma in his father, polyps in  his mother, colon cancer in several of his maternal uncles and sudden  death in 2 of his brothers, one of who had autism, and the only was  morbidly obese.   REVIEW OF SYSTEMS:  Taken in detail on work sheet, negative except as  outlined above.   PHYSICAL EXAMINATION:  This is a pleasant somewhat tense, ambulatory  white male in no acute distress.  Normal vital signs.  HEENT:  Unremarkable.  Oropharynx is clear.  NECK:  Supple without cervical adenopathy or tenderness.  Trachea  is  midline.  No thyromegaly.  Carotid upstrokes brisk without any bruits.  Lung fields are perfectly clear bilaterally to auscultation and  percussion.  Regular rate and rhythm without murmur, gallop, or rub.  He had a 1/6  decrescendo diastolic murmur.  ABDOMEN:  Soft, benign.  EXTREMITIES:  Warm without calf tenderness, cyanosis, clubbing, or  edema.  SKIN:  Multiple, typical and somewhat atypical nevi, especially over the  anterior trunk.  GENITOURINARY:  Testes descended bilaterally, no nodules, no evidence of  inguinal hernia.  RECTAL:  Mild BPH, smooth texture.  Stool guaiac was negative.   TSH was normal.  CRP was less than 1.  PSA was 1.7.  LFTs were normal.  BNP was normal.  CBC with differential was normal.  Urinalysis was  unremarkable, and lipid profile revealed a total cholesterol of 187 with  an HDL of 36.9 compared to 36 last, and an LDL was  122 compared to 129  last year.   IMPRESSION:  1. Asymptomatic aortic insufficiency associated with premature      ventricular contractions.  He does have a history of sudden death      in the family and has been seen by Dr. Ladona Ridgel who recommends risk      reduction and regular cardiac followup which I have reinforced.  2. Multiple atypical nevi, followed by Dr. Nicholas Lose, scheduled.  3. Diverticulosis and a history of colon polyps, followed by Dr.      Kinnie Scales.  4. Chronic rhinitis, controlled with Flonase.  5. Health maintenance.  He will need update on tetanus next year, not      yet a candidate for Pneumovax.   Overall, I think the patient is very healthy but needs more consistent  about aerobic exercise, at least 30 minutes 3 times a week.     Charlaine Dalton. Sherene Sires, MD, [facility]  Electronically Signed    MBW/MedQ  DD: 06/27/2007  DT: 06/28/2007  Job #: [id number]

## 2011-02-14 NOTE — Assessment & Plan Note (Signed)
[facility]                         ELECTROPHYSIOLOGY OFFICE NOTE   ROLLY, MAGRI                     MRN:          [id number]  DATE:04/01/2008                            DOB:          1951-04-13    Mr. Karstens returns today for followup.  He is a very pleasant middle-  aged male with a history of rheumatic heart disease secondary to acute  rheumatic fever as a child who has a history of aortic insufficiency  which has been mild-to-moderate in the past.  He returns today for  followup.  When I last saw him a year ago, he was concerned because he  had 2 siblings who died suddenly and he had palpitations.  His  palpitations have improved, and he has otherwise been stable.  He denies  chest pain.  He denies shortness of breath.  He has had no dizzy spells  or lightheaded spells.  He denies peripheral edema.   MEDICATIONS:  1. Aspirin 81 a day.  2. Fish oil supplements.  3. Multiple vitamin.  4. Flonase.   PHYSICAL EXAMINATION:  GENERAL: He is a pleasant, well-appearing, middle-  aged man in no distress.  VITAL SIGNS: Blood pressure was 128/71, pulse 62 and regular, and  respirations were 18.  Weight was 198 pounds.  NECK:  No jugular distention.  LUNGS:  Clear bilaterally to auscultation.  No wheezes, rales, or  rhonchi are present.  No increased work of breathing.  CARDIAC: Regular rate and rhythm.  Normal S1 and S2.  There is a soft  and fairly prolonged diastolic murmur or aortic insufficiency present.  ABDOMEN:  Soft and nontender.  There is no organomegaly.  EXTREMITIES:  No edema.   EKG demonstrates sinus rhythm with occasional PVCs.   IMPRESSION:  1. History of rheumatic heart disease with mild-to-moderate aortic      insufficiency.  2. Palpitations with no other symptoms.  3. Family history of sudden cardiac death.   DISCUSSION:  Overall, Mr. Shurley was stable.  I have recommend that he  continue his current medical therapy.   Fasting lipids are scheduled.  At  this point, his prior lipids have been such that  I would not recommend statin therapy for primary prevention of coronary  artery disease.  I will see the patient back in 1 year.     Doylene Canning. Ladona Ridgel, MD  Electronically Signed    GWT/MedQ  DD: 04/01/2008  DT: 04/01/2008  Job #: [id number]

## 2011-02-17 NOTE — Assessment & Plan Note (Signed)
[facility]                               PULMONARY OFFICE NOTE   CURT, OATIS                     MRN:          [id number]  DATE:05/09/2006                            DOB:          07/22/1951    HISTORY:  This is a 60 year old white male with a minimal remote smoking  history who presents for follow-up evaluation of chronic rhinitis, aortic  insufficiency with history of palpitations that were evaluated with a 24-  hour Holter monitor in March of 2004 and found to have infrequent PVCs and  PACs that had nothing to do with his symptoms.  In the meantime, he has had  two brothers died of sudden death.  One had autism and the other was  morbidly obese and was taking pain medicine for wrist injury.  He is  understandably concerned about the issue of sudden death and what could be  done to screen him for risk.  He is not aerobically active and somewhat  stressed out about issues related to his daughter's adolescent problems.  He  denies, however, exertional chest discomfort, dyspnea with activity, fevers,  chills, sweats, leg swelling or palpitations related to activity.   PAST MEDICAL HISTORY:  1. Multiple atypical nevi followed by Dr. Nicholas Lose with one level 2 melanoma      with a single level 2 melanoma excised from his low back in May of      2003.  2. History of polyps followed by Dr. Kinnie Scales most recently August of 2005,      normal except diverticulosis.  3. Chronic rhinitis responsive to Flonase.   ALLERGIES:  None known.   MEDICATIONS:  1. Baby aspirin 1 daily.  2. Flonase 1 daily.   SOCIAL HISTORY:  He quit smoking 34 years ago.  He works as an Economist.   FAMILY HISTORY:  Positive for multiple myeloma in his father.  Polyps in his  mother.  Colon cancer in several of his brothers and two sudden deaths as  noted.   REVIEW OF SYSTEMS:  Taken in detail on the work sheet and significant for  the problems as  outlined above.   PHYSICAL EXAMINATION:  This is a pleasant ambulatory white male in no acute  distress.  He has normal vital signs.  HEENT is unremarkable.  Oropharynx is  clear.  Dentition intact.  Ear canal is clear bilaterally.  Nasal turbinates  reveal minimal edema.  Neck was supple without cervical adenopathy or  tenderness.  Trachea is midline.  Carotid upstrokes are brisk without any  bruits.  Chest was completely clear bilaterally to auscultation and  percussion.  There regular rate and rhythm without murmur, click or rub.  No  significant ectopy noted. Abdomen is soft and benign with no palpable  organomegaly, masses or tenderness.  Femoral pulses were present bilaterally  but no bruits.  Genitourinary and testes descended bilaterally.  No nodules.  Rectal revealed to be smooth texture.  No organomegaly.  Extremities were  warm without calf tenderness, cyanosis, clubbing.  Pedal pulses were present  bilaterally.  Neurologic no focal deficits.  Pathologic reflexes.  Skin exam  was warm and dry.   EKG was normal with normal intervals and QRS duration.  CBC was normal.  Total cholesterol was 188 with an LDL of 129, HDL of 36, CRP was 1, TSH was  normal.  Chemistry profile was normal.  Urinalysis was unremarkable.  Chest  x-ray was not indicated and was not done.   IMPRESSION:  1. Asymptomatic aortic insufficiency which actually cannot be detected      with a stethoscope.  I believe it would be appropriate to repeat his      echocardiogram at this point to compare to the one that was done in      2003, especially given problem 2.  2. Sudden death in multiple family members:  However, the one brother was      autistic and the other brother was morbidly obese and taking narcotics      and most likely had obstructive sleep apnea by the patient's      understanding.  After what happened, I think it would be reasonable to      do a stress exercise test to see if he has any  significant ectopy or      ischemic change and then recommend consistent aerobic exercise.  3. Multiple atypical nevi with a history of level 2 melanoma:  Follow up      by Dr. Nicholas Lose already scheduled.  4. Diverticulosis with history of colon polyps:  Followed by Dr. Kinnie Scales.  5. Chronic rhinitis with adequate control of Flonase.  6. Health maintenance:  He was updated on tetanus in 1999.  A TSA was not      done but he has no family history and negative exam.  Given the option      of having one drawn on his next visit if he would like to do so.                                   Charlaine Dalton. Sherene Sires, MD, [facility]   MBW/MedQ  DD:  05/10/2006  DT:  05/10/2006  Job #:  [id number]

## 2011-02-17 NOTE — Procedures (Signed)
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 He also has a history  of palpitations, aortic insufficiency.  He was referred for a screening  treadmill test.   BASELINE DATA:  Blood pressure was 113/66 with a resting heart rate of 82.  EKG shows sinus rhythm with occasional PVCs, nonspecific ST, T wave changes.   EXERCISE DATA:  The patient exercised for 12 minutes on a standard Bruce  protocol. Has a peak of 13.4 METS. Testing was stopped due to leg fatigue.  There was no chest pain.  Peak blood pressure was 204/73, peak heart rate  was 166 which was 99% of age predicted maximum heart rate. Exercise EKG  showed nonspecific upsloping ST changes which are nondiagnostic. In early  recovery there were recurrence of his PVCs and a brief run of bigeminy. In  recovery he had a brisk heart rate recovery dropping from 166 to 116 after 2  minutes.   ASSESSMENT:  Normal exercise treadmill test with no evidence of exercise  induced ischemic changes. He has excellent functional capacity with a brisk  heart rate recovery, all of which are good prognostic signs. He did have a  hypertensive response to exercise with some mild bigeminy in recovery.   RECOMMENDATIONS:  Continued risk factor management.                                   Bevelyn Buckles. Bensimhon, MD   DRB/MedQ  DD:  05/24/2006  DT:  05/24/2006  Job #:  [id number]

## 2011-02-17 NOTE — Assessment & Plan Note (Signed)
[facility]                           ELECTROPHYSIOLOGY OFFICE NOTE   DHRUVA, ORNDOFF                     MRN:          [id number]  DATE:06/20/2006                            DOB:          03-Apr-1951    REFERRING PHYSICIAN:  Charlaine Dalton. Wert, MD, FCCP   REASON FOR VISIT:  Evaluation of palpitations in the context of a very  strong family history for sudden cardiac death in a patient with mild aortic  insufficiency.   HISTORY OF PRESENT ILLNESS:  The patient is a very pleasant 60 year old man  who originally is from [place], [place].  The patient had a remote  history of rheumatic fever when he was a child, but thought to have no  significant sequelae of rheumatic heart disease per se.  He is notable for  mild aortic insufficiency by echo several years ago.  The patient is quite  appropriately anxious as he has had two brothers who died suddenly.  The  first had autism and lived in a group home secondary to his autism.  The  second was morbidly obese and had documented sleep apnea, but did not use a  CPAP machine and died while he was sleeping.  Both brothers were in their  35's.  The patient is in his 35's.  His parents both lived to be elderly,  mother being still alive.  He has no grandparents or aunts and uncles or  cousins who died suddenly.  He does have rare palpitations and has undergone  cardiac monitoring which demonstrates occasional and rare PVC's, but no  nonsustained VT and also underwent cardiopulmonary stress testing where he  walked on a Bruce protocol without inducible arrhythmia.  He managed to  complete 12 minutes on a Bruce Protocol with a peak of 13.4 mets with a peak  blood pressure of 204 mmHg.  There were no diagnostic EKG changes, and he  had bigeminy in recovery but no significant arrhythmias.   SOCIAL HISTORY:  The patient is an Pensions consultant in town.  He denies tobacco use,  having stopped smoking cigarettes in  1972.  He has 1-2 alcohol beverages per  week.   REVIEW OF SYSTEMS:  As noted in the HPI.   FAMILY HISTORY:  Additionally, his family history is notable for father  dying in his 18's of multiple myeloma.   PHYSICAL EXAMINATION:  GENERAL:  Pleasant, well-appearing man in no acute  distress.  VITAL SIGNS:  Blood pressure 136/68, pulse 60 and regular, respirations 18,  weight 201 pounds.  HEENT:  Normocephalic, atraumatic.  Pupils equal and round.  Oropharynx is  moist.  Sclerae is anicteric.  NECK:  No jugular venous distention.  No thyromegaly.  Trachea was midline.  Carotids are 2+ and symmetric.  LUNGS:  Clear bilaterally to auscultation.  No wheezes, rales or rhonchi.  No increased work of breathing.  CARDIOVASCULAR:  Regular rate and rhythm.  Grade 2/6 diastolic murmur heard  best at the right sternal border, radiating all the way down to the left  lower sternal border.  No S3 gallop.  No S4  gallop.  PMI was not enlarged,  nor was it laterally displaced.  ABDOMEN:  Soft, nontender.  No organomegaly.  Bowel sounds were present.  No  rebound or guarding.  EXTREMITIES:  No cyanosis, clubbing or edema.  Pulses were 2+ and symmetric.  NEUROLOGICAL:  Alert and oriented x3 with cranial nerves intact.  Strength  5/5 and symmetric.   STUDIES:  EKG demonstrates sinus rhythm with normal axis intervals.  QT  interval is normal.  There is no suggestion of Brugada syndrome on the EKG.   IMPRESSION:  1.  Symptomatic PVC's in a patient with significant family history for      sudden cardiac death.  2.  Mild to moderate aortic insufficiency by echo and physical examination.   DISCUSSION:  Overall, I spent over 40 minutes today counseling Mr. Primo  on the likely nature that he is at risk for sudden cardiac death.  The  patient does have slightly increased LDL cholesterol at 107, and with his  strong family history of sudden death and no clear certainty what the  etiology was, I  recommended he initiate a program of exercise by walking on  a treadmill or ride on an exercise bike for 20 minutes or more a day 3-4  days a week.  We would plan to repeat his fasting lipids and if they remain  over 100, consider initiation of Statin therapy.  He does have aortic  insufficiency, and the natural history of this is that as time goes on it  may worsen, and for this reason I recommend that we repeat a 2D echo in two  years to see if there has been any progression of his AI.  Finally, at his  AI as at worst moderate, and there are no other structural heart problems.  He does not require antibiotic prophylaxis for dental surgery based on the  new guidelines for prevention of infectious endocarditis.  I plan to see the  patient back in 3-4 months to see how he is doing.                                   Doylene Canning. Ladona Ridgel, MD   GWT/MedQ  DD:  06/20/2006  DT:  06/22/2006  Job #:  [id number]   cc:   Charlaine Dalton. Sherene Sires, MD, FCCP

## 2011-02-17 NOTE — Assessment & Plan Note (Signed)
 [facility]                         ELECTROPHYSIOLOGY OFFICE NOTE   Leon Gibson, Leon Gibson                     MRN:          [id number]  DATE:09/04/2006                            DOB:          Oct 09, 1950    HISTORY OF PRESENT ILLNESS:  Leon Gibson returns today for followup. He  is a very pleasant middle aged man with history of aortic insufficiency  and palpitations and a strong family history of sudden cardiac death,  who was initially seen by me back in September for evaluation. He has  continued to do very well, although for the last week or two, he has  noticed increasing stress at home and increasing palpitations. He denies  chest pain or shortness of breath. He admits to being fairly inactive  from an exercise perspective.   PHYSICAL EXAMINATION:  GENERAL:  On examination today, he is a pleasant,  well appearing man in no acute distress.  VITAL SIGNS:  Blood pressure 130/76, pulse 57 and regular. Respiratory  rate 18. Weight 202 pounds.  NECK:  No jugular venous distention.  LUNGS:  Clear to auscultation bilaterally.  CARDIOVASCULAR:  Regular rate and rhythm with a grade 2 out of 6 murmur  of aortic insufficiency. There was no S3 and S4 gallop present.  EXTREMITIES:  No clubbing, cyanosis, or edema.   LABORATORY DATA:  EKG demonstrates sinus bradycardia, otherwise normal.   IMPRESSION:  1. Aortic insufficiency.  2. Symptomatic premature ventricular contractions.  3. Dyslipidemia.   DISCUSSION:  Overall, Leon Gibson is stable. His heart murmur does not  sound any different than it did back in August. His fasting lipids  demonstrated low HDL and a slightly increased LDL. I have recommended  diet, exercise, and fish oil supplements. We will plan to repeat his  fasting lipids in several months.     Doylene Canning. Ladona Ridgel, MD  Electronically Signed    GWT/MedQ  DD: 09/04/2006  DT: 09/04/2006  Job #: [id number]   cc:   Charlaine Dalton. Sherene Sires, MD,  FCCP

## 2011-03-15 ENCOUNTER — Ambulatory Visit (HOSPITAL_COMMUNITY)
Admission: RE | Admit: 2011-03-15 | Discharge: 2011-03-15 | Disposition: A | Discharge status: Home / Self Care | Payer: BC Managed Care – PPO | Source: Ambulatory Visit | Attending: Internal Medicine | Admitting: Internal Medicine

## 2011-03-15 DIAGNOSIS — I351 Nonrheumatic aortic (valve) insufficiency: Secondary | ICD-10-CM

## 2011-03-15 DIAGNOSIS — I359 Nonrheumatic aortic valve disorder, unspecified: Secondary | ICD-10-CM

## 2011-03-15 DIAGNOSIS — I080 Rheumatic disorders of both mitral and aortic valves: Secondary | ICD-10-CM | POA: Insufficient documentation

## 2011-03-28 ENCOUNTER — Telehealth: Payer: Self-pay | Admitting: Internal Medicine

## 2011-03-28 NOTE — Telephone Encounter (Signed)
Test results

## 2011-03-28 NOTE — Telephone Encounter (Signed)
Pt aware Echo results  Will get an Apt in 4-6 weeks with Dr Ladona Ridgel .  EDD/ESD =53/ 30  2012 59/38  Pt aware valve size has gotten larger

## 2011-03-29 ENCOUNTER — Telehealth: Payer: Self-pay | Admitting: Internal Medicine

## 2011-03-29 NOTE — Telephone Encounter (Signed)
Spoke with patient  Let him know Dr Ladona Ridgel is the one who recommended 4-6 week follow up  Dr Ladona Ridgel looked at both the echos and said the there is a small change but it does not require surgery at this time  I put the measurements in my last phone note.  Let pt know Dr Ladona Ridgel had seen both echos and that I don't make recommendations without the doctor's advice

## 2011-03-29 NOTE — Telephone Encounter (Signed)
Pt was called to set up 4-6 week fu appt from echo , he states that due to increased leakage he should be seen sooner

## 2011-04-27 ENCOUNTER — Encounter: Payer: Self-pay | Admitting: Internal Medicine

## 2011-05-02 ENCOUNTER — Ambulatory Visit (INDEPENDENT_AMBULATORY_CARE_PROVIDER_SITE_OTHER): Payer: BC Managed Care – PPO | Admitting: Internal Medicine

## 2011-05-02 ENCOUNTER — Encounter: Payer: Self-pay | Admitting: Internal Medicine

## 2011-05-02 VITALS — BP 156/74 | HR 51 | Ht 72.0 in | Wt 196.8 lb

## 2011-05-02 DIAGNOSIS — I359 Nonrheumatic aortic valve disorder, unspecified: Secondary | ICD-10-CM

## 2011-05-02 DIAGNOSIS — I10 Essential (primary) hypertension: Secondary | ICD-10-CM

## 2011-05-02 MED ORDER — RAMIPRIL 2.5 MG PO CAPS: 2.5000 mg | ORAL_CAPSULE | Freq: Every day | ORAL | Status: DC

## 2011-05-02 NOTE — Patient Instructions (Signed)
Your physician wants you to follow-up in: June of 2013 with Echo prior You will receive a reminder letter in the mail two months in advance. If you don't receive a letter, please call our office to schedule the follow-up appointment.  Your physician has requested that you have an echocardiogram. Echocardiography is a painless test that uses sound waves to create images of your heart. It provides your doctor with information about the size and shape of your heart and how well your heart's chambers and valves are working. This procedure takes approximately one hour. There are no restrictions for this procedure. ---in June prior to appointment  Your physician has recommended you make the following change in your medication: start Altace 2.5mg  daily  Your physician recommends that you return for lab work on 05/11/2011 at 8:30am for a BMP

## 2011-05-03 ENCOUNTER — Encounter: Payer: Self-pay | Admitting: Internal Medicine

## 2011-05-03 DIAGNOSIS — I10 Essential (primary) hypertension: Secondary | ICD-10-CM | POA: Insufficient documentation

## 2011-05-03 NOTE — Assessment & Plan Note (Signed)
His blood pressure has been elevated. Along with his AI, will start with low dose Ramipril. He will maintain a low sodium diet.

## 2011-05-03 NOTE — Progress Notes (Signed)
HPI Leon Gibson returns today for followup. He is a pleasant middle age man with a h/o aortic insufficiency and HTN. He underwent 2D echo several weeks ago. His LV dimensions have increased somewhat. He denies c/p, sob, or peripheral edema. No other complaints. He notes that over the past few months he has had occaisional episodes of HTN.  No Known Allergies   Current Outpatient Prescriptions  Medication Sig Dispense Refill  . aspirin 81 MG tablet Take 81 mg by mouth daily.        . fish oil-omega-3 fatty acids 1000 MG capsule Take 2 g by mouth 4 (four) times daily.        . fluticasone (FLONASE) 50 MCG/ACT nasal spray 1 spray by Nasal route 2 (two) times daily.        . Multiple Vitamin (MULTIVITAMIN) capsule Take 1 capsule by mouth daily.        . sildenafil (VIAGRA) 50 MG tablet Take 50 mg by mouth daily as needed.        . ramipril (ALTACE) 2.5 MG capsule Take 1 capsule (2.5 mg total) by mouth daily.  30 capsule  11     Past Medical History  Diagnosis Date  . Chronic rhinitis   . Diverticulitis of colon (without mention of hemorrhage)   . Aortic valve disorders   . Colon polyp   . Melanoma     L paralumbar 2003, no djuvant rx  . Weight gain   . Healthcare maintenance   . Rheumatic heart disease     ROS:   All systems reviewed and negative except as noted in the HPI.   Past Surgical History  Procedure Date  . Melonoma removal     one level 2 melanoma with a single level 2 melanoma excised from his low back in aMay of 2003  . Colonoscopy     8/05 and 4/11     Family History  Problem Relation Age of Onset  . Depression Brother   . Colon polyps Mother      History   Social History  . Marital Status: Married    Spouse Name: N/A    Number of Children: N/A  . Years of Education: N/A   Occupational History  . Not on file.   Social History Main Topics  . Smoking status: Former Games developer  . Smokeless tobacco: Never Used   Comment: teenager only   . Alcohol Use:  Yes     occ  . Drug Use: No  . Sexually Active: Not on file   Other Topics Concern  . 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 HEART:  Regular rate rhythm, with a 2/6 diastolic murmur, no rubs, no clicks Abd:  soft, positive bowel sounds, no organomegally, no rebound, no guarding Ext:  3 plus pulses, no edema, no cyanosis, no clubbing Skin:  No rashes no nodules Neuro:  CN II through XII intact, motor grossly intact   Assess/Plan:

## 2011-05-03 NOTE — Assessment & Plan Note (Signed)
He remains asymptomatic. His LV has enlarged slightly. Will plan to repeat his echo in 10 months (one year from last study). With his HTN, I have recommended he start low dose Ramipril.

## 2011-05-11 ENCOUNTER — Other Ambulatory Visit (INDEPENDENT_AMBULATORY_CARE_PROVIDER_SITE_OTHER): Payer: BC Managed Care – PPO | Admitting: *Deleted

## 2011-05-11 DIAGNOSIS — I10 Essential (primary) hypertension: Secondary | ICD-10-CM

## 2011-05-11 LAB — BASIC METABOLIC PANEL
CO2: 29 mEq/L (ref 19–32)
Chloride: 103 mEq/L (ref 96–112)
Creatinine, Ser: 0.8 mg/dL (ref 0.4–1.5)
Potassium: 4.1 mEq/L (ref 3.5–5.1)
Sodium: 140 mEq/L (ref 135–145)

## 2011-05-18 ENCOUNTER — Telehealth: Payer: Self-pay | Admitting: Internal Medicine

## 2011-05-18 NOTE — Telephone Encounter (Signed)
Pt calling wanting lab results--labs normal  and given to pt --then he wants to know when he will be seen again, what is the "PLAN''-----advised to continue checking BP as he was instructed and i will forward his questions to [facility] and dr taylor--nt

## 2011-05-18 NOTE — Telephone Encounter (Signed)
Test results

## 2011-05-22 ENCOUNTER — Encounter: Payer: Self-pay | Admitting: *Deleted

## 2011-05-22 NOTE — Telephone Encounter (Signed)
F/u as scheduled

## 2011-05-26 NOTE — Telephone Encounter (Signed)
Spoke with pt his BP's are good and he is going to follow up in 03/2012 with Echo prior

## 2011-11-14 ENCOUNTER — Telehealth: Payer: Self-pay | Admitting: Internal Medicine

## 2011-11-16 NOTE — Progress Notes (Signed)
Addended by: Dennis Bast F on: 11/16/2011 05:49 PM   Modules accepted: Orders

## 2012-02-05 ENCOUNTER — Ambulatory Visit (HOSPITAL_COMMUNITY): Payer: BC Managed Care – PPO | Attending: Internal Medicine

## 2012-02-05 ENCOUNTER — Other Ambulatory Visit: Payer: Self-pay

## 2012-02-05 DIAGNOSIS — I10 Essential (primary) hypertension: Secondary | ICD-10-CM | POA: Insufficient documentation

## 2012-02-05 DIAGNOSIS — I359 Nonrheumatic aortic valve disorder, unspecified: Secondary | ICD-10-CM | POA: Insufficient documentation

## 2012-02-05 DIAGNOSIS — I079 Rheumatic tricuspid valve disease, unspecified: Secondary | ICD-10-CM | POA: Insufficient documentation

## 2012-02-12 ENCOUNTER — Other Ambulatory Visit: Payer: Self-pay | Admitting: Internal Medicine

## 2012-02-12 ENCOUNTER — Telehealth: Payer: Self-pay | Admitting: Internal Medicine

## 2012-02-12 MED ORDER — FLUTICASONE PROPIONATE 50 MCG/ACT NA SUSP: 1.0000 | Freq: Two times a day (BID) | NASAL | Status: DC

## 2012-02-12 NOTE — Telephone Encounter (Signed)
I spoke with pt and he is requesting a refill on his flonase. He is aware he i is overdue fro f/u and will schedule an apt when he comes back. rx has been sent and nothing further was needed

## 2012-02-20 ENCOUNTER — Encounter: Payer: Self-pay | Admitting: Internal Medicine

## 2012-02-20 ENCOUNTER — Ambulatory Visit (INDEPENDENT_AMBULATORY_CARE_PROVIDER_SITE_OTHER): Payer: BC Managed Care – PPO | Admitting: Internal Medicine

## 2012-02-20 VITALS — BP 120/58 | HR 69 | Ht 72.0 in | Wt 187.0 lb

## 2012-02-20 DIAGNOSIS — I359 Nonrheumatic aortic valve disorder, unspecified: Secondary | ICD-10-CM

## 2012-02-20 DIAGNOSIS — I10 Essential (primary) hypertension: Secondary | ICD-10-CM

## 2012-02-20 MED ORDER — RAMIPRIL 5 MG PO CAPS: 5.0000 mg | ORAL_CAPSULE | Freq: Every day | ORAL | Status: DC

## 2012-02-20 NOTE — Assessment & Plan Note (Signed)
His blood pressure has been mildly elevated and I recommended that the patient increase his dose of ramipril from 2.5 to 5 mg daily.

## 2012-02-20 NOTE — Assessment & Plan Note (Signed)
The patient is asymptomatic. I've reviewed serial echoes with Dr. Huston Foley. In summary, his ventricular dimensions were a bit overestimated at the time of his echo a year ago. His current ventricular dimensions are approximately 55 mm at end diastole. His aortic sufficiency is moderate at most. He will undergo a period of watchful waiting. I plan to repeat his 2-D echo in a year.

## 2012-02-20 NOTE — Patient Instructions (Signed)
Your physician wants you to follow-up in: 12 months with Dr Court Joy will receive a reminder letter in the mail two months in advance. If you don't receive a letter, please call our office to schedule the follow-up appointment.---With Echo Prior to appointment  Your physician has recommended you make the following change in your medication:  1) Increase Altace to 5mg  daily  Your physician has requested that you have an echocardiogram. Echocardiography is a painless test that uses sound waves to create images of your heart. It provides your doctor with information about the size and shape of your heart and how well your heart's chambers and valves are working. This procedure takes approximately one hour. There are no restrictions for this procedure.

## 2012-02-20 NOTE — Progress Notes (Signed)
HPI [name] returns today for followup. He is a very pleasant 61 year old man with a history of aortic insufficiency and hypertension. When I saw the patient last a year ago a 2-D echo was obtained which suggested that his left ventricular cavity had enlarged despite his aortic insufficiency been moderate at worst. The patient has been stable over the last year. He underwent repeat 2-D echo several weeks ago and returns today for followup. The initial echo report suggested that his dimensions had reduced by 10 mm. In the interim the patient has done well except for hypertension. A log of his blood pressures demonstrates systolics in the 1:30 to 140 range. No Known Allergies   Current Outpatient Prescriptions  Medication Sig Dispense Refill  . aspirin 81 MG tablet Take 81 mg by mouth daily.        . fluticasone (FLONASE) 50 MCG/ACT nasal spray Place 1 spray into the nose 2 (two) times daily.  16 g  0  . Multiple Vitamin (MULTIVITAMIN) capsule Take 1 capsule by mouth daily.        . ramipril (ALTACE) 5 MG capsule Take 1 capsule (5 mg total) by mouth daily.  90 capsule  3  . sildenafil (VIAGRA) 50 MG tablet Take 50 mg by mouth daily as needed.        Marland Kitchen DISCONTD: ramipril (ALTACE) 2.5 MG capsule Take 1 capsule (2.5 mg total) by mouth daily.  30 capsule  11     Past Medical History  Diagnosis Date  . Chronic rhinitis   . Diverticulitis of colon (without mention of hemorrhage)   . Aortic valve disorders   . Colon polyp   . Melanoma     L paralumbar 2003, no djuvant rx  . Weight gain   . Healthcare maintenance   . Rheumatic heart disease     ROS:   All systems reviewed and negative except as noted in the HPI.   Past Surgical History  Procedure Date  . Melonoma removal     one level 2 melanoma with a single level 2 melanoma excised from his low back in aMay of 2003  . Colonoscopy     8/05 and 4/11     Family History  Problem Relation Age of Onset  . Depression Brother   .  Colon polyps Mother      History   Social History  . Marital Status: Married    Spouse Name: N/A    Number of Children: N/A  . Years of Education: N/A   Occupational History  . Not on file.   Social History Main Topics  . Smoking status: Former Games developer  . Smokeless tobacco: Never Used   Comment: teenager only   . Alcohol Use: Yes     occ  . Drug Use: No  . Sexually Active: Not on file   Other Topics Concern  . 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 HEART:  Regular rate rhythm, 2/6 diastolic murmur heard best at the right upper sternal border, no rubs, no clicks Abd:  soft, positive bowel sounds, no organomegally, no rebound, no guarding Ext:  2 plus pulses, no edema, no cyanosis, no clubbing Skin:  No rashes no nodules Neuro:  CN II through XII intact, motor grossly intact   Assess/Plan:

## 2012-02-23 ENCOUNTER — Telehealth: Payer: Self-pay | Admitting: Internal Medicine

## 2012-02-23 DIAGNOSIS — Z0000 Encounter for general adult medical examination without abnormal findings: Secondary | ICD-10-CM

## 2012-02-23 NOTE — Telephone Encounter (Signed)
Orders have been placed and pt is aware. Nothing further was needed 

## 2012-02-23 NOTE — Telephone Encounter (Signed)
Dr wert pls advise if pt can come in earlier for labs? And if so what labs are needed?

## 2012-02-23 NOTE — Telephone Encounter (Signed)
Fasting lipids, lft's, tsh, bmet, u/a and psa

## 2012-02-28 ENCOUNTER — Telehealth: Payer: Self-pay | Admitting: Internal Medicine

## 2012-02-28 NOTE — Telephone Encounter (Signed)
Spoke with patient.  Dr Ladona Ridgel was to have someone look at his echo again and call him I let him know I would discuss with Dr Ladona Ridgel tomorrow and call him back

## 2012-02-28 NOTE — Telephone Encounter (Signed)
New msg Pt was following up about echo he had done. Please call

## 2012-02-29 NOTE — Telephone Encounter (Signed)
Dr Ladona Ridgel discussed with patient.  Explained to him that things are stable not much difference in two studies.  His Aortic valve leakage is stable.

## 2012-03-28 ENCOUNTER — Other Ambulatory Visit (INDEPENDENT_AMBULATORY_CARE_PROVIDER_SITE_OTHER): Payer: BC Managed Care – PPO

## 2012-03-28 DIAGNOSIS — Z0000 Encounter for general adult medical examination without abnormal findings: Secondary | ICD-10-CM

## 2012-03-28 LAB — HEPATIC FUNCTION PANEL
ALT: 16 U/L (ref 0–53)
AST: 16 U/L (ref 0–37)
Bilirubin, Direct: 0.1 mg/dL (ref 0.0–0.3)
Total Bilirubin: 1.1 mg/dL (ref 0.3–1.2)

## 2012-03-28 LAB — URINALYSIS
Specific Gravity, Urine: 1.025 (ref 1.000–1.030)
Total Protein, Urine: NEGATIVE
Urine Glucose: NEGATIVE
Urobilinogen, UA: 0.2 (ref 0.0–1.0)

## 2012-03-28 LAB — BASIC METABOLIC PANEL
CO2: 27 mEq/L (ref 19–32)
Calcium: 9 mg/dL (ref 8.4–10.5)
Chloride: 104 mEq/L (ref 96–112)
Glucose, Bld: 86 mg/dL (ref 70–99)
Sodium: 139 mEq/L (ref 135–145)

## 2012-03-28 LAB — LIPID PANEL: Cholesterol: 183 mg/dL (ref 0–200)

## 2012-03-29 ENCOUNTER — Telehealth: Payer: Self-pay | Admitting: Internal Medicine

## 2012-03-29 NOTE — Telephone Encounter (Signed)
Notes Recorded by Nyoka Cowden, MD on 03/28/2012 at 2:53 PM Call patient : Studies are unremarkable, no change in recs - will discuss in detail at [facility]  ---  [email or web address]

## 2012-03-29 NOTE — Progress Notes (Signed)
Quick Note:  Spoke with pt and notified of results per Dr. Wert. Pt verbalized understanding and denied any questions.  ______ 

## 2012-03-29 NOTE — Telephone Encounter (Signed)
Spoke with pt and notified of results per Dr. Wert. Pt verbalized understanding and denied any questions. 

## 2012-04-03 ENCOUNTER — Ambulatory Visit (INDEPENDENT_AMBULATORY_CARE_PROVIDER_SITE_OTHER): Payer: BC Managed Care – PPO | Admitting: Internal Medicine

## 2012-04-03 ENCOUNTER — Encounter: Payer: Self-pay | Admitting: Internal Medicine

## 2012-04-03 VITALS — BP 140/82 | HR 54 | Temp 97.6°F | Ht 72.0 in | Wt 187.2 lb

## 2012-04-03 DIAGNOSIS — E7800 Pure hypercholesterolemia, unspecified: Secondary | ICD-10-CM

## 2012-04-03 DIAGNOSIS — I10 Essential (primary) hypertension: Secondary | ICD-10-CM

## 2012-04-03 DIAGNOSIS — I359 Nonrheumatic aortic valve disorder, unspecified: Secondary | ICD-10-CM

## 2012-04-03 DIAGNOSIS — Z0000 Encounter for general adult medical examination without abnormal findings: Secondary | ICD-10-CM

## 2012-04-03 MED ORDER — SILDENAFIL CITRATE 50 MG PO TABS: 50.0000 mg | ORAL_TABLET | Freq: Every day | ORAL | Status: DC | PRN

## 2012-04-03 MED ORDER — FLUTICASONE PROPIONATE 50 MCG/ACT NA SUSP: 1.0000 | Freq: Two times a day (BID) | NASAL | Status: DC

## 2012-04-03 NOTE — Progress Notes (Deleted)
asdf

## 2012-04-03 NOTE — Patient Instructions (Signed)
Your target blood pressure is around 120/80 or less, let Dr Ladona Ridgel know if it is consistently above this.

## 2012-04-03 NOTE — Progress Notes (Signed)
Subjective:     Patient ID: Leon Gibson, male   DOB: 06/15/1951 MRN: [id number]  HPI  Brief patient profile:  72 yowm minimal smoking as teenager with AI known since 1999 followed by [name] with h/o palpitations with coffee    HYPERCHOLESTEROLEMIA (ICD-272.0)  Target < 130 LDL    Problem # 2: AORTIC INSUFFICIENCY (ICD-424.1)    Problem # 3: RHINITIS, CHRONIC (ICD-472.0)   04/03/2012 f/u ov/Cyncere Ruhe CPX  F/u multiple chronic issues but excellent ex tolerance and no limitations from desired activities.  Sleeping ok without nocturnal  or early am exacerbation  of respiratory  c/o's . Also denies any obvious fluctuation of symptoms with weather or environmental changes or other aggravating or alleviating factors except as outlined above   ROS  The following are not active complaints unless bolded sore throat, dysphagia, dental problems, itching, sneezing,  nasal congestion or excess/ purulent secretions, ear ache,   fever, chills, sweats, unintended wt loss, pleuritic or exertional cp, hemoptysis,  orthopnea pnd or leg swelling, presyncope, palpitations, heartburn, abdominal pain, anorexia, nausea, vomiting, diarrhea  or change in bowel or urinary habits, change in stools or urine, dysuria,hematuria,  rash, arthralgias, visual complaints, headache, numbness weakness or ataxia or problems with walking or coordination,  change in mood/affect or memory.        Past Medical History:  RHINITIS, CHRONIC (ICD-472.0)  Hx of DIVERTICULITIS, COLON (ICD-562.11)............................Marland Kitchen Medoff  Hx of POLYP, COLON (ICD-211.3)  - Colonoscopy 8/05, 12/2009  AORTIC INSUFFICIENCY (ICD-424.1)........................................Marland KitchenLadona Ridgel  -History of rheumatic heart disease  - Echo 02/20/12 Melanoma..................................................................................Marland KitchenLomax  - L paralumbar 2003, no adjuvant rx  Weight Gain  - ideal < 191 Target wt = 219 for BMI < 30  HEALTH  MAINTENANCE................................................................Marland KitchenWert  - Td August 07, 2008  - CPX 04/03/2012    Family History:  MM in father  colon polyps mother  sudden death older brother no clear dx but mo/osa  depression broth   Social History:  Former smoker, teenager only  ETOH occ  Attorney           Review of Systems     Objective:   Physical Exam  wt 201 August 07, 2008 > 204 August 11, 2009 > 208 November 05, 2009 > 203 November 18, 2009 > 198 December 09, 2010 > 04/03/2012 187  Ambulatory healthy appearing wm in no acute distress with frequent throat clearing  HEENT: nl dentition, turbinates, and orophanx. Nl external ear canals without cough reflex  Neck without JVD/Nodes/TM  Lungs clear to A and P bilaterally without cough on insp or exp maneuvers  RRR II/VI diastolic m  Abd soft and benign with nl excursion in the supine position. No bruits or organomegaly  Ext warm without calf tenderness, cyanosis clubbing or edema  GU testes down bilaterally, neg IH  Rectal very mild bph, stool g neg  MS nl gait, no joint restrictions  Neuro: intact sensorium, no deficits Skin: no lesions        Assessment:          Plan:

## 2012-04-04 NOTE — Assessment & Plan Note (Signed)
Reviewed last echo and need to keep afterload down consistently > rx acei per Dr Ladona Ridgel

## 2012-04-04 NOTE — Assessment & Plan Note (Signed)
Marginally Adequate control on present rx, reviewed > defer further titration by Dr Ladona Ridgel with goals reviewed with pt

## 2012-04-04 NOTE — Assessment & Plan Note (Signed)
-   Target LDL < 130 due to Type A, stress, ? Pos fm hx brother  Lab Results  Component Value Date   LDLCALC 113* 03/28/2012    Adequate control on present rx, reviewed - note HDL quite good also> no change rx (diet and lifestyle)

## 2012-10-08 ENCOUNTER — Telehealth: Payer: Self-pay | Admitting: Internal Medicine

## 2012-10-08 DIAGNOSIS — I493 Ventricular premature depolarization: Secondary | ICD-10-CM

## 2012-10-08 NOTE — Telephone Encounter (Signed)
New problem   C/O having palpitation

## 2012-10-08 NOTE — Telephone Encounter (Signed)
Patient states that has had PVC's in the past , but over the past 2 weeks he has been having the PVC's more frequent. Even  Pt   has decreased to coffee intake and alcohol which during the holidays he probable has one or two glazes in the evenings, now pt is not taken any alcohol and still he feels that he is having frequent PVC.  Dr. Ladona Ridgel aware of pt's symptoms and recommends for pt to have a 24 hours holeter monitor and to to have pt come for a R/ OV in two weeks. Pt aware. Pt  will be out of town on January 23 rd to the 29 th.  A 24 hours holter monitor order placed in [facility].

## 2012-10-09 ENCOUNTER — Telehealth: Payer: Self-pay | Admitting: *Deleted

## 2012-10-09 ENCOUNTER — Encounter (INDEPENDENT_AMBULATORY_CARE_PROVIDER_SITE_OTHER): Payer: BC Managed Care – PPO

## 2012-10-09 DIAGNOSIS — I493 Ventricular premature depolarization: Secondary | ICD-10-CM

## 2012-10-09 DIAGNOSIS — R002 Palpitations: Secondary | ICD-10-CM

## 2012-10-09 NOTE — Telephone Encounter (Signed)
24 hr holter monitor placed on pt 10/09/12 TK

## 2012-10-29 ENCOUNTER — Ambulatory Visit: Payer: BC Managed Care – PPO | Admitting: Cardiology

## 2012-11-06 ENCOUNTER — Ambulatory Visit (INDEPENDENT_AMBULATORY_CARE_PROVIDER_SITE_OTHER): Payer: BC Managed Care – PPO | Admitting: Internal Medicine

## 2012-11-06 ENCOUNTER — Encounter: Payer: Self-pay | Admitting: Internal Medicine

## 2012-11-06 VITALS — BP 156/73 | HR 85 | Ht 72.0 in | Wt 191.8 lb

## 2012-11-06 DIAGNOSIS — I359 Nonrheumatic aortic valve disorder, unspecified: Secondary | ICD-10-CM

## 2012-11-06 DIAGNOSIS — I351 Nonrheumatic aortic (valve) insufficiency: Secondary | ICD-10-CM

## 2012-11-06 DIAGNOSIS — R002 Palpitations: Secondary | ICD-10-CM

## 2012-11-06 NOTE — Progress Notes (Signed)
HPI [name] returns today for followup. He is a very pleasant 63 year old man with aortic insufficiency, hypertension, palpitations, and a family history of sudden death. The patient has been stable over the last several months except for palpitations. He were cardiac monitor which demonstrated rare PVCs. No pauses or tachycardia palpitations or episodes of tachycardia were noted. He denies chest pain or shortness of breath. No Known Allergies   Current Outpatient Prescriptions  Medication Sig Dispense Refill  . aspirin 81 MG tablet Take 81 mg by mouth daily.        . fluticasone (FLONASE) 50 MCG/ACT nasal spray Place 1 spray into the nose 2 (two) times daily.  16 g  11  . Multiple Vitamin (MULTIVITAMIN) capsule Take 1 capsule by mouth daily.        . ramipril (ALTACE) 5 MG capsule Take 1 capsule (5 mg total) by mouth daily.  90 capsule  3  . sildenafil (VIAGRA) 50 MG tablet Take 1 tablet (50 mg total) by mouth daily as needed.  10 tablet  11     Past Medical History  Diagnosis Date  . Chronic rhinitis   . Diverticulitis of colon (without mention of hemorrhage)   . Aortic valve disorders   . Colon polyp   . Melanoma     L paralumbar 2003, no djuvant rx  . Weight gain   . Healthcare maintenance   . Rheumatic heart disease     ROS:   All systems reviewed and negative except as noted in the HPI.   Past Surgical History  Procedure Date  . Melonoma removal     one level 2 melanoma with a single level 2 melanoma excised from his low back in aMay of 2003  . Colonoscopy     8/05 and 4/11     Family History  Problem Relation Age of Onset  . Depression Brother   . Colon polyps Mother      History   Social History  . Marital Status: Married    Spouse Name: N/A    Number of Children: N/A  . Years of Education: N/A   Occupational History  . Not on file.   Social History Main Topics  . Smoking status: Former Games developer  . Smokeless tobacco: Never Used     Comment:  teenager only   . Alcohol Use: Yes     Comment: occ  . Drug Use: No  . Sexually Active: Not on file   Other Topics Concern  . 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 HEART:  Regular rate rhythm, grade 2/6 diastolic murmur, no rubs, no clicks Abd:  soft, positive bowel sounds, no organomegally, no rebound, no guarding Ext:  2 plus pulses, no edema, no cyanosis, no clubbing Skin:  No rashes no nodules Neuro:  CN II through XII intact, motor grossly intact   Assess/Plan:

## 2012-11-06 NOTE — Assessment & Plan Note (Signed)
He is currently asymptomatic. His blood pressure is reasonably well controlled. He will undergo repeat 2-D echo in 6-8 months.

## 2012-11-06 NOTE — Assessment & Plan Note (Signed)
The etiology of his palpitations due to PVCs. As he had no sustained arrhythmias, he will undergo watchful waiting.

## 2013-01-29 ENCOUNTER — Telehealth: Payer: Self-pay | Admitting: Internal Medicine

## 2013-01-29 NOTE — Telephone Encounter (Signed)
I spoke with pt and he stated he has been having some anxiety issues and have not been able to sleep at night. He states he has never been on any anti anxiety medications. I scheduled pt to come in and discuss this with TP tomorrow morning. Nothing further was needed

## 2013-01-30 ENCOUNTER — Encounter: Payer: Self-pay | Admitting: Adult Health

## 2013-01-30 ENCOUNTER — Ambulatory Visit (INDEPENDENT_AMBULATORY_CARE_PROVIDER_SITE_OTHER): Payer: BC Managed Care – PPO | Admitting: Adult Health

## 2013-01-30 ENCOUNTER — Telehealth: Payer: Self-pay | Admitting: Internal Medicine

## 2013-01-30 VITALS — BP 132/76 | HR 58 | Temp 97.5°F | Ht 72.0 in | Wt 189.4 lb

## 2013-01-30 DIAGNOSIS — F411 Generalized anxiety disorder: Secondary | ICD-10-CM

## 2013-01-30 DIAGNOSIS — F419 Anxiety disorder, unspecified: Secondary | ICD-10-CM

## 2013-01-30 MED ORDER — ALPRAZOLAM 0.25 MG PO TABS: 0.2500 mg | ORAL_TABLET | Freq: Two times a day (BID) | ORAL | Status: DC | PRN

## 2013-01-30 MED ORDER — SERTRALINE HCL 25 MG PO TABS: 25.0000 mg | ORAL_TABLET | Freq: Every day | ORAL | Status: DC

## 2013-01-30 NOTE — Progress Notes (Signed)
Subjective:     Patient ID: Leon Gibson, male   DOB: 1950/12/07 MRN: [id number]  HPI Brief patient profile:  62  yowm minimal smoking as teenager with AI known since 1999 followed by [name] with h/o palpitations with coffee    HYPERCHOLESTEROLEMIA (ICD-272.0)  Target < 130 LDL    Problem # 2: AORTIC INSUFFICIENCY (ICD-424.1)    Problem # 3: RHINITIS, CHRONIC (ICD-472.0)   04/03/2012 f/u ov/Wert CPX  F/u multiple chronic issues but excellent ex tolerance and no limitations from desired activities. >>  01/30/2013 Acute OV  Complains of difficulty sleeping, aggitation, exesive worrying x6 months-1 yr with an abundance of outside stress. Lots of family stress, mother passed away in [place]. Lots of loose ends that are causing worrying.  Having more trouble with older Daughter  Work is stressful. .  The combination of all this is causing him  Waking up frequently, worries, trouble going back to sleep.  Makes him more anxious.  Wakes up tired.  No hx of anxiety or depression. 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  Does not feel depressed.  Trouble focusing at times.  Plays tennis several times a week. -helps him  Remains very active with family .  Denies suicidal ideations. No heat or cold intolerances. No wt loss. Good appetite .         Past Medical History:  RHINITIS, CHRONIC (ICD-472.0)  Hx of DIVERTICULITIS, COLON (ICD-562.11)............................Marland Kitchen Medoff  Hx of POLYP, COLON (ICD-211.3)  - Colonoscopy 8/05, 12/2009  AORTIC INSUFFICIENCY (ICD-424.1)........................................Marland KitchenLadona Ridgel  -History of rheumatic heart disease  - Echo 02/20/12 Melanoma..................................................................................Marland KitchenLomax  - L paralumbar 2003, no adjuvant rx  Weight Gain  - ideal < 191 Target wt = 219 for BMI < 30  HEALTH MAINTENANCE................................................................Marland KitchenWert  - Td August 07, 2008  - CPX 04/03/2012     Family History:  MM in father  colon polyps mother  sudden death older brother no clear dx but mo/osa  depression broth   Social History:  Former smoker, 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 Nl external ear canals without cough reflex  Neck without JVD/Nodes/TM  Lungs clear to A and P bilaterally without cough on insp or exp maneuvers  RRR Abd soft and benign with nl excursion in the supine position. No bruits or organomegaly  Ext warm without calf tenderness, cyanosis clubbing or edema    MS nl gai Neuro: intact sensorium, no deficits Skin: no lesions        Assessment:          Plan:

## 2013-01-30 NOTE — Patient Instructions (Addendum)
Begin Zoloft 25mg  daily  May use Xanax 0.25mg  1/2 -1 Twice daily  As needed  Anxiety  STress reducers , exercise to help with anxiety  Call back if you would like referral to counselor .  follow up Dr. Sherene Sires  In 2 months and As needed   Please contact office for sooner follow up if symptoms do not improve or worsen or seek emergency care

## 2013-01-30 NOTE — Telephone Encounter (Signed)
I spoke with pt. I advised hi to fast and labs will be done after appt. Nothing further was needed

## 2013-02-04 DIAGNOSIS — F419 Anxiety disorder, unspecified: Secondary | ICD-10-CM | POA: Insufficient documentation

## 2013-02-04 NOTE — Assessment & Plan Note (Signed)
Anxiety -generalized   Plan  Begin Zoloft 25mg  daily  May use Xanax 0.25mg  1/2 -1 Twice daily  As needed  Anxiety  STress reducers , exercise to help with anxiety  Call back if you would like referral to counselor .  follow up Dr. Sherene Sires  In 2 months and As needed   Please contact office for sooner follow up if symptoms do not improve or worsen or seek emergency care

## 2013-02-09 ENCOUNTER — Other Ambulatory Visit: Payer: Self-pay | Admitting: Internal Medicine

## 2013-02-11 ENCOUNTER — Other Ambulatory Visit: Payer: Self-pay | Admitting: Internal Medicine

## 2013-03-03 ENCOUNTER — Other Ambulatory Visit (HOSPITAL_COMMUNITY): Payer: BC Managed Care – PPO

## 2013-03-25 ENCOUNTER — Ambulatory Visit (HOSPITAL_COMMUNITY): Payer: BC Managed Care – PPO | Attending: Cardiology | Admitting: Radiology

## 2013-03-25 DIAGNOSIS — E7800 Pure hypercholesterolemia, unspecified: Secondary | ICD-10-CM | POA: Insufficient documentation

## 2013-03-25 DIAGNOSIS — R002 Palpitations: Secondary | ICD-10-CM | POA: Insufficient documentation

## 2013-03-25 DIAGNOSIS — I359 Nonrheumatic aortic valve disorder, unspecified: Secondary | ICD-10-CM

## 2013-03-25 DIAGNOSIS — I351 Nonrheumatic aortic (valve) insufficiency: Secondary | ICD-10-CM

## 2013-03-25 DIAGNOSIS — I10 Essential (primary) hypertension: Secondary | ICD-10-CM | POA: Insufficient documentation

## 2013-03-25 DIAGNOSIS — Z8241 Family history of sudden cardiac death: Secondary | ICD-10-CM | POA: Insufficient documentation

## 2013-03-25 DIAGNOSIS — Z87891 Personal history of nicotine dependence: Secondary | ICD-10-CM | POA: Insufficient documentation

## 2013-03-25 NOTE — Progress Notes (Signed)
Echocardiogram performed.  

## 2013-04-08 ENCOUNTER — Ambulatory Visit (INDEPENDENT_AMBULATORY_CARE_PROVIDER_SITE_OTHER): Payer: BC Managed Care – PPO | Admitting: Internal Medicine

## 2013-04-08 ENCOUNTER — Encounter: Payer: Self-pay | Admitting: Internal Medicine

## 2013-04-08 VITALS — BP 130/64 | HR 52 | Ht 72.0 in | Wt 187.0 lb

## 2013-04-08 DIAGNOSIS — I359 Nonrheumatic aortic valve disorder, unspecified: Secondary | ICD-10-CM

## 2013-04-08 DIAGNOSIS — I10 Essential (primary) hypertension: Secondary | ICD-10-CM

## 2013-04-08 DIAGNOSIS — R002 Palpitations: Secondary | ICD-10-CM

## 2013-04-08 NOTE — Patient Instructions (Addendum)
Your physician recommends that you continue on your current medications as directed. Please refer to the Current Medication list given to you today.  Your physician wants you to follow-up in: 1 year. You will receive a reminder letter in the mail two months in advance. If you don't receive a letter, please call our office to schedule the follow-up appointment.  

## 2013-04-08 NOTE — Assessment & Plan Note (Signed)
His blood pressure is well controlled. He will continue a low-sodium diet.

## 2013-04-08 NOTE — Assessment & Plan Note (Signed)
The patient's aortic insufficiency appears to be stable. No change in medical therapy today. We'll plan to repeat his echo in the next 12-24 months.

## 2013-04-08 NOTE — Progress Notes (Signed)
HPI [name] returns today for followup. He is a 62 year old man with a history of aortic insufficiency and sinus bradycardia, along with a history of hypertension which has been well-controlled. The patient has done well in the interim. Initially, there is a question of whether his left ventricle was dilating. This appears to be not the case. A recent 2-D echo demonstrates moderate aortic insufficiency and no LV dysfunction. His chamber sizes were not appreciably enlarged. His activity is good he denies chest pain or shortness of breath. No syncope. No Known Allergies   Current Outpatient Prescriptions  Medication Sig Dispense Refill  . ALPRAZolam (XANAX) 0.25 MG tablet Take 1 tablet (0.25 mg total) by mouth 2 (two) times daily as needed for anxiety.  30 tablet  0  . aspirin 81 MG tablet Take 81 mg by mouth daily.        . Multiple Vitamin (MULTIVITAMIN) capsule Take 1 capsule by mouth daily.        . ramipril (ALTACE) 5 MG capsule TAKE 1 CAPSULE (5 MG TOTAL) BY MOUTH DAILY.  90 capsule  2  . sertraline (ZOLOFT) 25 MG tablet Take 1 tablet (25 mg total) by mouth daily.  30 tablet  5  . sildenafil (VIAGRA) 50 MG tablet Take 1 tablet (50 mg total) by mouth daily as needed.  10 tablet  11  . fluticasone (FLONASE) 50 MCG/ACT nasal spray Place 1 spray into the nose 2 (two) times daily.  16 g  11   No current facility-administered medications for this visit.     Past Medical History  Diagnosis Date  . Chronic rhinitis   . Diverticulitis of colon (without mention of hemorrhage)   . Aortic valve disorders   . Colon polyp   . Melanoma     L paralumbar 2003, no djuvant rx  . Weight gain   . Healthcare maintenance   . Rheumatic heart disease     ROS:   All systems reviewed and negative except as noted in the HPI.   Past Surgical History  Procedure Laterality Date  . Melonoma removal      one level 2 melanoma with a single level 2 melanoma excised from his low back in aMay of 2003  .  Colonoscopy      8/05 and 4/11     Family History  Problem Relation Age of Onset  . Depression Brother   . Colon polyps Mother      History   Social History  . Marital Status: Married    Spouse Name: N/A    Number of Children: N/A  . Years of Education: N/A   Occupational History  . Not on file.   Social History Main Topics  . Smoking status: Former Smoker -- 1 years  . Smokeless tobacco: Never Used     Comment: teenager only   . Alcohol Use: Yes     Comment: occ  . Drug Use: No  . Sexually Active: Not on file   Other Topics Concern  . 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 HEART:  Regular rate rhythm, grade 2/6 systolic murmur and grade 2/6 diastolic blowing murmur, no rubs, no  clicks Abd:  soft, positive bowel sounds, no organomegally, no rebound, no guarding Ext:  2 plus pulses, no edema, no cyanosis, no clubbing Skin:  No rashes no nodules Neuro:  CN II through XII intact, motor grossly intact  EKG - sinus bradycardia  Assess/Plan:

## 2013-04-09 ENCOUNTER — Other Ambulatory Visit (INDEPENDENT_AMBULATORY_CARE_PROVIDER_SITE_OTHER): Payer: BC Managed Care – PPO

## 2013-04-09 ENCOUNTER — Ambulatory Visit (INDEPENDENT_AMBULATORY_CARE_PROVIDER_SITE_OTHER): Payer: BC Managed Care – PPO | Admitting: Internal Medicine

## 2013-04-09 ENCOUNTER — Ambulatory Visit (INDEPENDENT_AMBULATORY_CARE_PROVIDER_SITE_OTHER)
Admission: RE | Admit: 2013-04-09 | Discharge: 2013-04-09 | Disposition: A | Discharge status: Home / Self Care | Payer: BC Managed Care – PPO | Source: Ambulatory Visit | Attending: Internal Medicine | Admitting: Internal Medicine

## 2013-04-09 ENCOUNTER — Encounter: Payer: Self-pay | Admitting: Internal Medicine

## 2013-04-09 ENCOUNTER — Ambulatory Visit
Admission: RE | Admit: 2013-04-09 | Discharge: 2013-04-09 | Disposition: A | Discharge status: Home / Self Care | Payer: BC Managed Care – PPO | Source: Ambulatory Visit | Attending: Internal Medicine | Admitting: Internal Medicine

## 2013-04-09 ENCOUNTER — Other Ambulatory Visit: Payer: Self-pay | Admitting: Internal Medicine

## 2013-04-09 VITALS — BP 118/70 | HR 60 | Temp 97.3°F | Ht 70.5 in | Wt 185.0 lb

## 2013-04-09 DIAGNOSIS — R05 Cough: Secondary | ICD-10-CM

## 2013-04-09 DIAGNOSIS — E7800 Pure hypercholesterolemia, unspecified: Secondary | ICD-10-CM

## 2013-04-09 DIAGNOSIS — I10 Essential (primary) hypertension: Secondary | ICD-10-CM

## 2013-04-09 DIAGNOSIS — K5732 Diverticulitis of large intestine without perforation or abscess without bleeding: Secondary | ICD-10-CM

## 2013-04-09 DIAGNOSIS — N401 Enlarged prostate with lower urinary tract symptoms: Secondary | ICD-10-CM | POA: Insufficient documentation

## 2013-04-09 DIAGNOSIS — R351 Nocturia: Secondary | ICD-10-CM

## 2013-04-09 DIAGNOSIS — F419 Anxiety disorder, unspecified: Secondary | ICD-10-CM

## 2013-04-09 DIAGNOSIS — R059 Cough, unspecified: Secondary | ICD-10-CM

## 2013-04-09 DIAGNOSIS — D239 Other benign neoplasm of skin, unspecified: Secondary | ICD-10-CM

## 2013-04-09 DIAGNOSIS — F411 Generalized anxiety disorder: Secondary | ICD-10-CM

## 2013-04-09 LAB — CBC WITH DIFFERENTIAL/PLATELET
Eosinophils Absolute: 0.1 10*3/uL (ref 0.0–0.7)
Eosinophils Relative: 1.2 % (ref 0.0–5.0)
HCT: 47.1 % (ref 39.0–52.0)
Lymphs Abs: 1.4 10*3/uL (ref 0.7–4.0)
MCHC: 34.6 g/dL (ref 30.0–36.0)
MCV: 90.8 fl (ref 78.0–100.0)
Monocytes Absolute: 0.6 10*3/uL (ref 0.1–1.0)
Platelets: 143 10*3/uL — ABNORMAL LOW (ref 150.0–400.0)
WBC: 6.6 10*3/uL (ref 4.5–10.5)

## 2013-04-09 LAB — LIPID PANEL: Cholesterol: 236 mg/dL — ABNORMAL HIGH (ref 0–200)

## 2013-04-09 LAB — HEPATIC FUNCTION PANEL
ALT: 24 U/L (ref 0–53)
AST: 24 U/L (ref 0–37)
Albumin: 4.2 g/dL (ref 3.5–5.2)
Total Protein: 6.7 g/dL (ref 6.0–8.3)

## 2013-04-09 LAB — BASIC METABOLIC PANEL
BUN: 17 mg/dL (ref 6–23)
CO2: 26 mEq/L (ref 19–32)
Chloride: 103 mEq/L (ref 96–112)
Glucose, Bld: 87 mg/dL (ref 70–99)
Potassium: 4.8 mEq/L (ref 3.5–5.1)

## 2013-04-09 LAB — TSH: TSH: 0.97 u[IU]/mL (ref 0.35–5.50)

## 2013-04-09 LAB — LDL CHOLESTEROL, DIRECT: Direct LDL: 167.3 mg/dL

## 2013-04-09 LAB — PSA: PSA: 3.14 ng/mL (ref 0.10–4.00)

## 2013-04-09 NOTE — Progress Notes (Signed)
Subjective:     Patient ID: Leon Gibson, male   DOB: November 21, 1950 MRN: [id number]  HPI Brief patient profile:  62  yowm minimal smoking as teenager with AI known since 1999 followed by [name] with h/o palpitations with coffee    HYPERCHOLESTEROLEMIA (ICD-272.0)  Target < 130 LDL    Problem # 2: AORTIC INSUFFICIENCY (ICD-424.1)    Problem # 3: RHINITIS, CHRONIC (ICD-472.0)   04/03/2012 f/u ov/Brookelynne Dimperio CPX  F/u multiple chronic issues but excellent ex tolerance and no limitations from desired activities. >>  01/30/2013 Acute OV  Complains of difficulty sleeping, aggitation, exesive worrying x 6 months-1 yr with an abundance of outside stress. Lots of family stress, mother passed away in [place]. Lots of loose ends that are causing worrying.  Having more trouble with older Daughter  Work is stressful. .  The combination of all this is causing him  Waking up frequently, worries, trouble going back to sleep.  Makes him more anxious.  Wakes up tired.  No hx of anxiety or depression. 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  rx Begin Zoloft 25mg  daily  May use Xanax 0.25mg  1/2 -1 Twice daily  As needed  Anxiety  STress reducers , exercise to help with anxiety    04/09/2013 f/u ov/Kameron Blethen re HBP/insomnia/ED Chief Complaint  Patient presents with  . Annual Exam    Pt states doing well and denies any co's today.    no sob, tia, claudication, cp  Sleeping ok without nocturnal  or early am exacerbation  of respiratory  c/o's or need for noct saba. Also denies any obvious fluctuation of symptoms with weather or environmental changes or other aggravating or alleviating factors except as outlined above   Current Medications, Allergies,  were reviewed in [facility] electronic medical record.  ROS  The following are not active complaints unless bolded sore throat, dysphagia, dental problems, itching, sneezing,  nasal congestion or excess/ purulent secretions, ear ache,   fever, chills, sweats,  unintended wt loss, pleuritic or exertional cp, hemoptysis,  orthopnea pnd or leg swelling, presyncope, palpitations, heartburn, abdominal pain, anorexia, nausea, vomiting, diarrhea  or change in bowel or urinary habits, change in stools or urine, dysuria,hematuria,  rash, arthralgias, visual complaints, headache, numbness weakness or ataxia or problems with walking or coordination,  change in mood/affect or memory.               Past Medical History:  RHINITIS, CHRONIC (ICD-472.0)  Hx of DIVERTICULITIS, COLON (ICD-562.11)............................Marland Kitchen Medoff  Hx of POLYP, COLON (ICD-211.3)  - Colonoscopy 8/05, 12/2009  AORTIC INSUFFICIENCY (ICD-424.1)........................................Marland KitchenLadona Ridgel  -History of rheumatic heart disease  - Echo 02/20/12 Melanoma..................................................................................Marland KitchenLomax  - L paralumbar 2003, no adjuvant rx  Weight Gain  - ideal < 191 Target wt = 219 for BMI < 30  HEALTH MAINTENANCE................................................................Marland KitchenWert  - Td August 07, 2008  - CPX 04/09/2013    Family History:  MM in father  colon polyps mother  sudden death older brother no clear dx but mo/osa  depression broth   Social History:  Former smoker, teenager only  ETOH occ  Attorney                Objective:   Physical Exam  wt 201 August 07, 2008 > 204 August 11, 2009 > 208 November 05, 2009 > 203 November 18, 2009 > 198 December 09, 2010 > 04/03/2012 187 >189 01/30/13 > 185 04/09/2013 Ambulatory healthy appearing wm in no acute distress  HEENT: nl dentition, turbinates, and  orophanx. Nl external ear canals without cough reflex  Neck without JVD/Nodes/TM  Lungs clear to A and P bilaterally without cough on insp or exp maneuvers  RRR II-III/VI sem Abd soft and benign with nl excursion in the supine position. No bruits or organomegaly  Ext warm without calf tenderness, cyanosis clubbing or edema    MS nl  gai Neuro: intact sensorium, no deficits Skin:  Multiple asym nevi GU Testes down, no nodules or IH Rectal mod smooth no nodules, Stool G neg  CXR  04/09/2013 :   No active disease. No significant change.       Assessment:

## 2013-04-09 NOTE — Patient Instructions (Addendum)
Please remember to go to the lab and x-ray department downstairs for your tests - we will call you with the results when they are available.   Please schedule a follow up office visit in 12 months for CPX, call sooner if needed   Late add: ov 3 mo to review LDL and PSA trends

## 2013-04-10 ENCOUNTER — Telehealth: Payer: Self-pay | Admitting: Internal Medicine

## 2013-04-10 NOTE — Telephone Encounter (Signed)
Notes Recorded by Nyoka Cowden, MD on 04/09/2013 at 5:26 PM Call patient : Studies are unremarkable x ldl too high - options are to start statin or work on wt, diet and recheck in 3 months psa is slowly trending up c/w bph but if he wants to see urology to be on safe side that's fine or we can revisit in one year  Notes Recorded by Nyoka Cowden, MD on 04/09/2013 at 1:10 PM Call pt: Reviewed cxr and no acute change so no change in recommendations made at [facility] with pt and notified of results per Dr. Sherene Sires. Pt verbalized understanding Would like MW to call him to discuss before he makes any decisions on what to do  Please call his cell [phone] thanks!

## 2013-04-11 NOTE — Telephone Encounter (Signed)
Discussed in detail all the  indications, usual  risks and alternatives (statins)  relative to the benefits with patient who agrees to proceed with diet/ex and return in 3 mo to check his progress

## 2013-04-11 NOTE — Progress Notes (Signed)
Quick Note:  Pt aware ______ 

## 2013-04-13 NOTE — Assessment & Plan Note (Signed)
Not sure he needs zoloft, ok to stop as long as xanax use does not increase as a result

## 2013-04-13 NOTE — Assessment & Plan Note (Signed)
Multiple, with h/o  level 2 melanoma > f/u Lora Lomax

## 2013-04-13 NOTE — Assessment & Plan Note (Signed)
Adequate control on present rx, reviewed rx 

## 2013-04-13 NOTE — Assessment & Plan Note (Signed)
Minimal at present and prob related to pnds/ note acei use and low threshold to stop if worsens

## 2013-04-13 NOTE — Assessment & Plan Note (Signed)
-   Target LDL < 130 due to Type A, stress, ? Pos fm hx brothers   Lab Results  Component Value Date   CHOL 236* 04/09/2013   HDL 57.30 04/09/2013   LDLCALC 113* 03/28/2012   LDLDIRECT 167.3 04/09/2013   TRIG 102.0 04/09/2013   CHOLHDL 4 04/09/2013     Not at target, reviewed with pt > rx diet and ex and f/u fasting in 3 months

## 2013-04-13 NOTE — Assessment & Plan Note (Signed)
psa trend is slightly up x last 5 years, but exam is completely benign > offered urology f/u but declined for now, revisit next ov

## 2013-04-25 ENCOUNTER — Other Ambulatory Visit: Payer: Self-pay | Admitting: Internal Medicine

## 2013-04-25 ENCOUNTER — Other Ambulatory Visit: Payer: Self-pay | Admitting: Adult Health

## 2013-04-29 ENCOUNTER — Telehealth: Payer: Self-pay | Admitting: Internal Medicine

## 2013-04-29 MED ORDER — ALPRAZOLAM 0.25 MG PO TABS: 0.2500 mg | ORAL_TABLET | Freq: Two times a day (BID) | ORAL | Status: DC | PRN

## 2013-04-29 NOTE — Telephone Encounter (Signed)
Rx has been called in. Pt is aware. 

## 2013-04-29 NOTE — Telephone Encounter (Signed)
Spoke with patient, patient requesting refill on Xanax 0.25mg - 1 tablet BID PRN anxiety Last filled 01/30/13 #30 w 0 refills Last OV: 04/09/13 w 1 yr follow up not scheduled at this time  [facility]  Dr.Wert please advise if this can be filled, thank you

## 2013-04-29 NOTE — Telephone Encounter (Signed)
Ok to refill #60 with 3 refills

## 2013-06-13 ENCOUNTER — Telehealth: Payer: Self-pay | Admitting: Internal Medicine

## 2013-06-13 MED ORDER — SILDENAFIL CITRATE 50 MG PO TABS: 50.0000 mg | ORAL_TABLET | Freq: Every day | ORAL | Status: DC | PRN

## 2013-06-13 NOTE — Telephone Encounter (Signed)
I spoke with pt. Confirmed RX needed for viagra and he stated sending #10 is fine. Nothing further needed

## 2013-08-07 ENCOUNTER — Other Ambulatory Visit: Payer: Self-pay

## 2014-03-09 ENCOUNTER — Telehealth: Payer: Self-pay | Admitting: Internal Medicine

## 2014-03-09 DIAGNOSIS — I351 Nonrheumatic aortic (valve) insufficiency: Secondary | ICD-10-CM

## 2014-03-09 NOTE — Telephone Encounter (Signed)
New message     Patient would like to have echo done prior to appt in July.

## 2014-03-09 NOTE — Telephone Encounter (Signed)
Pt calling to have Echo scheduled prior to appointment with Dr. Lovena Le on 04/08/14 Checked last ov about ordering echo.  Scheduled for 04/01/14 Horton Chin RN

## 2014-04-01 ENCOUNTER — Ambulatory Visit (HOSPITAL_COMMUNITY): Payer: BC Managed Care – PPO | Attending: Internal Medicine | Admitting: Cardiology

## 2014-04-01 DIAGNOSIS — I351 Nonrheumatic aortic (valve) insufficiency: Secondary | ICD-10-CM

## 2014-04-01 DIAGNOSIS — I359 Nonrheumatic aortic valve disorder, unspecified: Secondary | ICD-10-CM | POA: Insufficient documentation

## 2014-04-01 NOTE — Progress Notes (Signed)
Echo performed. 

## 2014-04-02 ENCOUNTER — Other Ambulatory Visit: Payer: Self-pay | Admitting: Internal Medicine

## 2014-04-08 ENCOUNTER — Ambulatory Visit (INDEPENDENT_AMBULATORY_CARE_PROVIDER_SITE_OTHER): Payer: BC Managed Care – PPO | Admitting: Internal Medicine

## 2014-04-08 ENCOUNTER — Encounter: Payer: Self-pay | Admitting: Internal Medicine

## 2014-04-08 VITALS — BP 119/62 | HR 60 | Ht 72.0 in | Wt 189.0 lb

## 2014-04-08 DIAGNOSIS — I351 Nonrheumatic aortic (valve) insufficiency: Secondary | ICD-10-CM

## 2014-04-08 DIAGNOSIS — R001 Bradycardia, unspecified: Secondary | ICD-10-CM

## 2014-04-08 DIAGNOSIS — I498 Other specified cardiac arrhythmias: Secondary | ICD-10-CM

## 2014-04-08 DIAGNOSIS — I359 Nonrheumatic aortic valve disorder, unspecified: Secondary | ICD-10-CM

## 2014-04-08 NOTE — Assessment & Plan Note (Signed)
He does not appear to have any significant worsening. He also has some AS. Will continue to undergo watchful waiting. I have asked him to avoid sodium, take anti-biotics before dental work and will recheck echo in a year. He will call us if he develops any symptoms.

## 2014-04-08 NOTE — Patient Instructions (Signed)
Your physician recommends that you continue on your current medications as directed. Please refer to the Current Medication list given to you today.  Your physician recommends that you schedule a follow-up appointment in: 12 months with Dr. Lovena Le.

## 2014-04-08 NOTE — Progress Notes (Signed)
HPI Leon Gibson returns today for followup. He is a 63 year old man with a history of aortic insufficiency and mild to moderate aortic stenosis and sinus bradycardia, along with a history of hypertension which has been well-controlled. The patient has done well in the interim. This appears to be not the case. A recent 2-D echo demonstrates moderate aortic insufficiency and stenosis and no LV dysfunction. His chamber sizes were not appreciably enlarged. His activity is good he denies chest pain or shortness of breath. No syncope. No Known Allergies   Current Outpatient Prescriptions  Medication Sig Dispense Refill  . aspirin 81 MG tablet Take 81 mg by mouth daily.        . fluticasone (FLONASE) 50 MCG/ACT nasal spray PLACE 1 SPRAY INTO THE NOSE 2 (TWO) TIMES DAILY.  16 g  11  . Multiple Vitamin (MULTIVITAMIN) capsule Take 1 capsule by mouth daily.        . ramipril (ALTACE) 5 MG capsule TAKE 1 CAPSULE (5 MG TOTAL) BY MOUTH DAILY.  90 capsule  2  . ramipril (ALTACE) 5 MG capsule TAKE 1 CAPSULE (5 MG TOTAL) BY MOUTH DAILY.  90 capsule  0  . sildenafil (VIAGRA) 50 MG tablet Take 1 tablet (50 mg total) by mouth daily as needed.  10 tablet  11   No current facility-administered medications for this visit.     Past Medical History  Diagnosis Date  . Chronic rhinitis   . Diverticulitis of colon (without mention of hemorrhage)   . Aortic valve disorders   . Colon polyp   . Melanoma     L paralumbar 2003, no djuvant rx  . Weight gain   . Healthcare maintenance   . Rheumatic heart disease   . Palpitations   . Sinus bradycardia     ROS:   All systems reviewed and negative except as noted in the HPI.   Past Surgical History  Procedure Laterality Date  . Melonoma removal      one level 2 melanoma with a single level 2 melanoma excised from his low back in aMay of 2003  . Colonoscopy      8/05 and 4/11     Family History  Problem Relation Age of Onset  . Depression Brother   .  Colon polyps Mother      History   Social History  . Marital Status: Married    Spouse Name: N/A    Number of Children: N/A  . Years of Education: N/A   Occupational History  . Not on file.   Social History Main Topics  . Smoking status: Former Smoker -- 1 years  . Smokeless tobacco: Never Used     Comment: teenager only   . Alcohol Use: Yes     Comment: occ  . Drug Use: No  . Sexual Activity: Not on file   Other Topics Concern  . Not on file   Social History Narrative   Attorney.      BP 119/62  Pulse 60  Ht 6' (1.829 m)  Wt 189 lb (85.73 kg)  BMI 25.63 kg/m2  Physical Exam:  Well appearing middle-aged man, NAD HEENT: Unremarkable Neck:  No JVD, no thyromegally Lungs:  Clear with no wheezes, rales, or rhonchi. HEART:  Regular rate rhythm, grade 2/6 systolic murmur and grade 2/6 diastolic blowing murmur, no rubs, no clicks Abd:  soft, positive bowel sounds, no organomegally, no rebound, no guarding Ext:  2 plus pulses, no edema, no cyanosis, no  clubbing Skin:  No rashes no nodules Neuro:  CN II through XII intact, motor grossly intact  EKG - sinus bradycardia  Assess/Plan:

## 2014-04-09 ENCOUNTER — Ambulatory Visit (INDEPENDENT_AMBULATORY_CARE_PROVIDER_SITE_OTHER): Payer: BC Managed Care – PPO | Admitting: Internal Medicine

## 2014-04-09 ENCOUNTER — Encounter: Payer: Self-pay | Admitting: Internal Medicine

## 2014-04-09 ENCOUNTER — Telehealth: Payer: Self-pay | Admitting: Internal Medicine

## 2014-04-09 ENCOUNTER — Other Ambulatory Visit (INDEPENDENT_AMBULATORY_CARE_PROVIDER_SITE_OTHER): Payer: BC Managed Care – PPO

## 2014-04-09 ENCOUNTER — Ambulatory Visit (INDEPENDENT_AMBULATORY_CARE_PROVIDER_SITE_OTHER)
Admission: RE | Admit: 2014-04-09 | Discharge: 2014-04-09 | Disposition: A | Discharge status: Home / Self Care | Payer: BC Managed Care – PPO | Source: Ambulatory Visit | Attending: Internal Medicine | Admitting: Internal Medicine

## 2014-04-09 VITALS — BP 132/66 | HR 57 | Temp 98.1°F | Ht 71.0 in | Wt 189.6 lb

## 2014-04-09 DIAGNOSIS — E7800 Pure hypercholesterolemia, unspecified: Secondary | ICD-10-CM

## 2014-04-09 DIAGNOSIS — Z0000 Encounter for general adult medical examination without abnormal findings: Secondary | ICD-10-CM

## 2014-04-09 DIAGNOSIS — R351 Nocturia: Secondary | ICD-10-CM

## 2014-04-09 DIAGNOSIS — I359 Nonrheumatic aortic valve disorder, unspecified: Secondary | ICD-10-CM

## 2014-04-09 DIAGNOSIS — N401 Enlarged prostate with lower urinary tract symptoms: Secondary | ICD-10-CM

## 2014-04-09 DIAGNOSIS — I10 Essential (primary) hypertension: Secondary | ICD-10-CM

## 2014-04-09 DIAGNOSIS — J310 Chronic rhinitis: Secondary | ICD-10-CM

## 2014-04-09 LAB — BASIC METABOLIC PANEL
BUN: 16 mg/dL (ref 6–23)
CHLORIDE: 103 meq/L (ref 96–112)
CO2: 29 meq/L (ref 19–32)
Calcium: 9.2 mg/dL (ref 8.4–10.5)
Creatinine, Ser: 0.9 mg/dL (ref 0.4–1.5)
GFR: 95.95 mL/min (ref >60.00)
GLUCOSE: 101 mg/dL — AB (ref 70–99)
POTASSIUM: 4.4 meq/L (ref 3.5–5.1)
Sodium: 138 mEq/L (ref 135–145)

## 2014-04-09 LAB — LIPID PANEL
CHOLESTEROL: 213 mg/dL — AB (ref 0–200)
HDL: 56.4 mg/dL (ref >39.00)
LDL CALC: 144 mg/dL — AB (ref 0–99)
NonHDL: 156.6
TRIGLYCERIDES: 62 mg/dL (ref 0.0–149.0)
Total CHOL/HDL Ratio: 4
VLDL: 12.4 mg/dL (ref 0.0–40.0)

## 2014-04-09 LAB — CBC WITH DIFFERENTIAL/PLATELET
BASOS ABS: 0 10*3/uL (ref 0.0–0.1)
Basophils Relative: 0.5 % (ref 0.0–3.0)
EOS ABS: 0.1 10*3/uL (ref 0.0–0.7)
Eosinophils Relative: 1.4 % (ref 0.0–5.0)
HEMATOCRIT: 45.7 % (ref 39.0–52.0)
HEMOGLOBIN: 15.6 g/dL (ref 13.0–17.0)
LYMPHS ABS: 1.4 10*3/uL (ref 0.7–4.0)
Lymphocytes Relative: 23.1 % (ref 12.0–46.0)
MCHC: 34.1 g/dL (ref 30.0–36.0)
MCV: 90.2 fl (ref 78.0–100.0)
MONO ABS: 0.5 10*3/uL (ref 0.1–1.0)
Monocytes Relative: 7.9 % (ref 3.0–12.0)
NEUTROS ABS: 4 10*3/uL (ref 1.4–7.7)
Neutrophils Relative %: 67.1 % (ref 43.0–77.0)
PLATELETS: 139 10*3/uL — AB (ref 150.0–400.0)
RBC: 5.06 Mil/uL (ref 4.22–5.81)
RDW: 12.8 % (ref 11.5–15.5)
WBC: 5.9 10*3/uL (ref 4.0–10.5)

## 2014-04-09 LAB — URINALYSIS
BILIRUBIN URINE: NEGATIVE
Hgb urine dipstick: NEGATIVE
KETONES UR: NEGATIVE
LEUKOCYTES UA: NEGATIVE
NITRITE: NEGATIVE
PH: 6 (ref 5.0–8.0)
SPECIFIC GRAVITY, URINE: 1.02 (ref 1.000–1.030)
TOTAL PROTEIN, URINE-UPE24: NEGATIVE
Urine Glucose: NEGATIVE
Urobilinogen, UA: 0.2 (ref 0.0–1.0)

## 2014-04-09 LAB — HEPATIC FUNCTION PANEL
ALBUMIN: 4.2 g/dL (ref 3.5–5.2)
ALT: 19 U/L (ref 0–53)
AST: 24 U/L (ref 0–37)
Alkaline Phosphatase: 62 U/L (ref 39–117)
BILIRUBIN TOTAL: 0.8 mg/dL (ref 0.2–1.2)
Bilirubin, Direct: 0.1 mg/dL (ref 0.0–0.3)
Total Protein: 6.7 g/dL (ref 6.0–8.3)

## 2014-04-09 LAB — PSA: PSA: 3.09 ng/mL (ref 0.10–4.00)

## 2014-04-09 LAB — TSH: TSH: 0.98 u[IU]/mL (ref 0.35–4.50)

## 2014-04-09 NOTE — Telephone Encounter (Signed)
Notes Recorded by Jonelle Sports, RN on 04/09/2014 at 4:06 PM lmomtcb for pt ------  Notes Recorded by Tanda Rockers, MD on 04/09/2014 at 10:20 AM Call pt: Reviewed cxr and no acute change so no change in recommendations made at ov  ---------------------------------------------------------------------------------------------------------------------------------------  Notes Recorded by Jonelle Sports, RN on 04/09/2014 at 4:06 PM lmomtcb for pt  Notes Recorded by Tanda Rockers, MD on 04/09/2014 at 12:45 PM Call patient : Studies are unremarkable x LDL up a bit as expected, work on wt / diet. psa trending back down so no w/u needed  ----------------------------------------------------------------------------------------------------------------------------------------  Called and spoke to pt. Informed pt of recs and results per MW. Pt verbalized understanding and denied any further questions or concerns at this time.

## 2014-04-09 NOTE — Patient Instructions (Addendum)
Please remember to go to the lab and x-ray department downstairs for your tests - we will call you with the results when they are available.      

## 2014-04-09 NOTE — Progress Notes (Signed)
Quick Note:  Called and spoke to pt. Informed pt of recs and results per MW. Pt verbalized understanding and denied any further questions or concerns at this time. ______

## 2014-04-09 NOTE — Progress Notes (Signed)
Quick Note:  lmomtcb for pt ______ 

## 2014-04-09 NOTE — Progress Notes (Signed)
Subjective:     Patient ID: Leon Gibson, male   DOB: [birth year] MRN: [id number]    Brief patient profile:  60  yowm malpractice defense lawyer  minimal smoking as teenager with AS/AI known since 1999 followed by [name] with h/o palpitations with coffee    HYPERCHOLESTEROLEMIA (ICD-272.0)  Target < 130 LDL   Problem # 2: AORTIC INSUFFICIENCY (ICD-424.1)   Problem # 3: RHINITIS, CHRONIC (ICD-472.0)      01/30/2013 Acute OV  Complains of difficulty sleeping, aggitation, exesive worrying x 6 months-1 yr with an abundance of outside stress. Lots of family stress, mother passed away in [place]. Lots of loose ends that are causing worrying.  Having more trouble with older Daughter  Work is stressful. .  The combination of all this is causing him  Waking up frequently, worries, trouble going back to sleep.  Makes him more anxious.  Wakes up tired.  No hx of anxiety or depression. Does classify himself as a Research officer, trade union.  rx Begin Zoloft 25mg  daily  May use Xanax 0.25mg  1/2 -1 Twice daily  As needed  Anxiety  STress reducers , exercise to help with anxiety    04/09/2013 f/u ov/Haliyah Fryman re HBP/insomnia/ED Chief Complaint  Patient presents with  . Annual Exam    Pt states doing well and denies any co's today.    no sob, tia, claudication, cp rec No change rx    04/09/2014 f/u ov/Carleta Woodrow re: annual eval/ hbp/ hyperlipidemia Chief Complaint  Patient presents with  . Annual Exam    Pt is fasting. Pt states doing well and denies any co's today.   Very poor ex / diet last year due to out of town business.  No   chronic cough, limiting sob or cp or chest tightness, subjective wheeze overt sinus or hb symptoms. No unusual exp hx or h/o childhood pna/ asthma or knowledge of premature birth.  Sleeping ok without nocturnal  or early am exacerbation  of respiratory  c/o's or need for noct saba. Also denies any obvious fluctuation of symptoms with weather or environmental changes or other aggravating or  alleviating factors except as outlined above   Current Medications, Allergies, Complete Past Medical History, Past Surgical History, Family History, and Social History were reviewed in [employer] record.  ROS  The following are not active complaints unless bolded sore throat, dysphagia, dental problems, itching, sneezing,  nasal congestion or excess/ purulent secretions, ear ache,   fever, chills, sweats, unintended wt loss, pleuritic or exertional cp, hemoptysis,  orthopnea pnd or leg swelling, presyncope, palpitations, heartburn, abdominal pain, anorexia, nausea, vomiting, diarrhea  or change in bowel or urinary habits, change in stools or urine, dysuria,hematuria,  rash, arthralgias, visual complaints, headache, numbness weakness or ataxia or problems with walking or coordination,  change in mood/affect or memory.                    Past Medical History:  RHINITIS, CHRONIC (ICD-472.0)  Hx of DIVERTICULITIS, COLON (ICD-562.11)............................Marland Kitchen Medoff  Hx of POLYP, COLON (ICD-211.3)  - Colonoscopy 05/2004, 12/2009  AORTIC INSUFFICIENCY (ICD-424.1)........................................Marland KitchenLovena Le  -History of rheumatic heart disease  - Echo 02/20/12 Melanoma..................................................................................Marland KitchenLomax  - L paralumbar 2003, no adjuvant rx  Weight Gain  - ideal < 191 Target wt = 219 for BMI < 30  HEALTH MAINTENANCE................................................................Marland KitchenWert  - Td August 07, 2008  - CPX 04/09/2013, 04/09/2014    Family History:  MM in father  colon polyps mother  sudden death older  brother no clear dx but mo/osa  depression broth   Social History:  Former smoker, teenager only  ETOH occ  Attorney                Objective:   Physical Exam   Wt 201 August 07, 2008 > 204 August 11, 2009 > 208 November 05, 2009 > 203 November 18, 2009 > 198 December 09, 2010 > 04/03/2012 187 >189  01/30/13 > 185 04/09/2013> 04/09/2014   189  Ambulatory healthy appearing wm in no acute distress  HEENT: nl dentition, turbinates, and orophanx. Nl external ear canals without cough reflex  Neck without JVD/Nodes/TM  Lungs clear to A and P bilaterally without cough on insp or exp maneuvers  RRR II-III/VI sem Abd soft and benign with nl excursion in the supine position. No bruits or organomegaly  Ext warm without calf tenderness, cyanosis clubbing or edema    MS nl gait Neuro: intact sensorium, no deficits Skin:  Multiple asym nevi GU Testes down, no nodules or IH Rectal mod bph smooth no nodules, Stool G neg       CXR  04/09/2014 :  The heart size and mediastinal contours are within normal limits. Both lungs are clear. The visualized skeletal structures are unremarkable.         Assessment:

## 2014-04-10 NOTE — Assessment & Plan Note (Signed)
-   Target LDL < 130 due to Type A, stress, ? Pos fm hx brothers  Lab Results  Component Value Date   CHOL 213* 04/09/2014   HDL 56.40 04/09/2014   LDLCALC 144* 04/09/2014   LDLDIRECT 167.3 04/09/2013   TRIG 62.0 04/09/2014   CHOLHDL 4 04/09/2014     Discussed with pt, wants to get on board with diet/ ex/ no statin for ow

## 2014-04-10 NOTE — Assessment & Plan Note (Signed)
Up to date on immunizations PSA's trending sltly back down, nl exam

## 2014-04-10 NOTE — Assessment & Plan Note (Signed)
PSA  3.14 to 3.09

## 2014-04-10 NOTE — Assessment & Plan Note (Signed)
Adequate control on present rx, reviewed > no change in rx needed   

## 2014-04-10 NOTE — Assessment & Plan Note (Signed)
Followed in Cardiology clinic/ [facility]/ Lovena Le  Also has AS

## 2014-04-10 NOTE — Assessment & Plan Note (Signed)
Adequate control on present rx, reviewed > no change in rx needed  (flonase)

## 2014-04-17 ENCOUNTER — Telehealth: Payer: Self-pay | Admitting: *Deleted

## 2014-04-17 NOTE — Telephone Encounter (Signed)
Patient requests rx for antibiotic for a dental procedure that he is having next week. Please advise on which one he needs. [facility] [address] Thanks, MI

## 2014-04-22 MED ORDER — AMOXICILLIN 500 MG PO CAPS: ORAL_CAPSULE | ORAL | Status: DC

## 2014-04-22 NOTE — Telephone Encounter (Signed)
New message    Patient requesting antibiotic for dental procedure. Was told by Dr. Lovena Le that he would need an antibiotic    Dental procedure  on 7/28.

## 2014-04-22 NOTE — Telephone Encounter (Signed)
Amoxicillin 500mg  take 4 pills 1 hour prior to procedure.  patient aware medication sent in

## 2014-06-02 ENCOUNTER — Other Ambulatory Visit: Payer: Self-pay | Admitting: Internal Medicine

## 2014-07-03 ENCOUNTER — Other Ambulatory Visit: Payer: Self-pay | Admitting: Internal Medicine

## 2014-07-05 ENCOUNTER — Other Ambulatory Visit: Payer: Self-pay | Admitting: Internal Medicine

## 2014-10-20 ENCOUNTER — Telehealth: Payer: Self-pay | Admitting: Internal Medicine

## 2014-10-20 MED ORDER — TADALAFIL 20 MG PO TABS: 20.0000 mg | ORAL_TABLET | Freq: Every day | ORAL | Status: DC | PRN

## 2014-10-20 NOTE — Telephone Encounter (Signed)
Spoke with patient-aware we are sending message to MW to advise on Cialis strength and dosing. Will call him back once MW has advised.

## 2014-10-20 NOTE — Telephone Encounter (Signed)
Spoke with patient-aware of approval for Cialis Rx and has been sent to [facility]. Nothing more needed at this time.

## 2014-10-20 NOTE — Telephone Encounter (Signed)
Ok try cialis 20 mg daily (may be be able to break in half if works as well and save some money)   Give him as many as he wants per month with 11 refills

## 2014-10-27 ENCOUNTER — Telehealth: Payer: Self-pay | Admitting: Internal Medicine

## 2014-10-27 MED ORDER — TADALAFIL 20 MG PO TABS: 20.0000 mg | ORAL_TABLET | Freq: Every day | ORAL | Status: DC

## 2014-10-27 NOTE — Telephone Encounter (Signed)
LMTC x 1  

## 2014-10-27 NOTE — Telephone Encounter (Signed)
Pt needed his Cialis prescription to be resent. The prescription was sent in for prn and per MW it needed to be taken daily. Rx has been resent.

## 2014-11-02 ENCOUNTER — Telehealth: Payer: Self-pay | Admitting: Internal Medicine

## 2014-11-02 MED ORDER — TADALAFIL 5 MG PO TABS: 5.0000 mg | ORAL_TABLET | Freq: Every day | ORAL | Status: DC | PRN

## 2014-11-02 NOTE — Telephone Encounter (Signed)
Spoke with pt. States that his Cialis prescription is to strong. It makes his face flush all day and is constantly urinating all day. Was given 20mg  tablets and it is hard to break them in half to make 10mg  tablets. Would like to have 5mg  tablets called in.  MW - please advise. Thanks.

## 2014-11-02 NOTE — Telephone Encounter (Signed)
Pt is aware of rx change approved by MW and has been sent to [facility]. Nothing more needed at this time.

## 2014-11-02 NOTE — Telephone Encounter (Signed)
Ok give him same # as before and 11 refill and 5 mg strength

## 2014-11-13 ENCOUNTER — Telehealth: Payer: Self-pay | Admitting: Internal Medicine

## 2014-11-13 MED ORDER — TADALAFIL 10 MG PO TABS: 10.0000 mg | ORAL_TABLET | Freq: Every day | ORAL | Status: DC | PRN

## 2014-11-13 NOTE — Telephone Encounter (Signed)
Pt returned call. Informed pt of the rx sent to preferred pharmacy. Rx called into pharmacy. Pt verbalized understanding and denied any further questions or concerns at this time.

## 2014-11-13 NOTE — Telephone Encounter (Signed)
Spoke with pt to relay info and he understands.  Pt is unsure if he is comfortable paying cash generic price at [employer].  Pt wants to know if we would be ok with sending in 10mg  tablets to his pharmacy and he can split them, that way he has 20 days worth of meds per every 25 days.    MW please advise.  Thank you.

## 2014-11-13 NOTE — Telephone Encounter (Signed)
ok 

## 2014-11-13 NOTE — Telephone Encounter (Signed)
lmtcb X1 for pt  

## 2014-11-13 NOTE — Telephone Encounter (Signed)
Patient cb, [phone]

## 2014-11-13 NOTE — Telephone Encounter (Signed)
LMTCB

## 2014-11-13 NOTE — Telephone Encounter (Signed)
Spoke with pt, states that his rx was supposed to be 30 tabs with 1 year's worth of refills.  He is only receiving 6 tabs at a time.  I called pharmacy to see why he is receiving only 6 tablets at a time, and per Lattie Haw (pharmacist) he is limited to 6 per 25 days per his insurance.  The number for his pharmacy is [phone].  MW please advise if there is an alternative to this medication or if we need to call the pharmacy to dispute this.  Thank you.

## 2014-11-13 NOTE — Telephone Encounter (Signed)
Sounds like it's an insurance issue and we can't over ride it (like we might be able to for a resp med)  but he might be better off just getting a cash only generic at [email or web address]

## 2014-12-23 ENCOUNTER — Other Ambulatory Visit: Payer: Self-pay | Admitting: Internal Medicine

## 2014-12-23 MED ORDER — FLUTICASONE PROPIONATE 50 MCG/ACT NA SUSP: NASAL | Status: AC

## 2014-12-31 ENCOUNTER — Other Ambulatory Visit: Payer: Self-pay | Admitting: Internal Medicine

## 2015-01-15 NOTE — Progress Notes (Addendum)
Anesthesia Chart Review:  Patient is a 64 year old male posted for right shoulder arthroscopy, biceps tenodesis, labral debridement, subacromial decompression, distal clavicle resection on 01/28/15 by Dr. Onnie Graham.   History includes former smoker, rheumatic heart disease with moderate AR/AS 04/2014, bradycardia, palpitations, chronic rhinitis, melanoma excision, diverticulitis. 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 PCP is listed as pulmonologist Dr. Christinia Gully.  Cardiologist is Dr. Lovena Le who felt patient was low risk for this procedure.  Meds include Amoxicillin prior to dental procedures, ASA, Flonase, ramipril, Cialis.   04/09/14 EKG: SB at 55 bpm, RSR prime in V1.  04/01/14 Echo: - Left ventricle: The cavity size was normal. There was mild focal basal hypertrophy of the septum. Systolic function was normal. The estimated ejection fraction was in the range of 55% to 60%. Wall motion was normal; there were no regional wall motion abnormalities. Doppler parameters are consistent with abnormal left ventricular relaxation (grade 1 diastolic dysfunction). - Aortic valve: Cusp separation was reduced. There was moderate stenosis. There was moderate regurgitation directed eccentrically in the LVOT and towards the mitral anterior leaflet. Peak velocity (S): 305 cm/s. Mean gradient (S): 19 mm Hg. - Aorta: Aortic root dimension: 42 mm (ED). - Ascending aorta: The ascending aorta was mildly dilated. Impressions: When compared to prior, aortic stenosis is minimally increased(prior peak velocity 2.3m/s, current 3.75m/s).  04/09/14 CXR: FINDINGS: The heart size and mediastinal contours are within normal limits. Both lungs are clear. The visualized skeletal structures are unremarkable. IMPRESSION: No active cardiopulmonary disease.  Preoperative labs noted.   He has been cleared by cardiology.  Labs appear acceptable for OR.  Further evaluation by his assigned anesthesiologist on the day of  surgery, but if no acute changes then I would anticipate that he could proceed as planned.  George Hugh [facility]/Anesthesiology Phone [phone] 01/18/2015 5:24 PM

## 2015-01-15 NOTE — Pre-Procedure Instructions (Signed)
BENEN WEIDA  01/15/2015   Your procedure is scheduled on: April 28th, Thursday   Report to [facility] Admitting at 5:30 AM.   Call this number if you have problems the morning of surgery: [phone]   Remember:   Do not eat food or drink liquids after midnight Wednesday.   Take these medicines the morning of surgery with A SIP OF WATER: Please use your Flonase that morning.   Do not wear jewelry - no rings or watches.  Do not wear lotions or colognes.  You may NOT wear deodorant the day of surgery.  Men may shave face and neck.   Do not bring valuables to the hospital.  [facility] is not responsible for any belongings or valuables.               Contacts, dentures or bridgework may not be worn into surgery.  Leave suitcase in the car. After surgery it may be brought to your room.  For patients admitted to the hospital, discharge time is determined by your treatment team.               Patients discharged the day of surgery will not be allowed to drive home.   Name and phone number of your driver:    Special Instructions: "Preparing for Surgery" instruction sheet.   Please read over the following fact sheets that you were given: Pain Booklet, Coughing and Deep Breathing and Surgical Site Infection Prevention

## 2015-01-18 ENCOUNTER — Encounter (HOSPITAL_COMMUNITY)
Admission: RE | Admit: 2015-01-18 | Discharge: 2015-01-18 | Disposition: A | Discharge status: Home / Self Care | Payer: BLUE CROSS/BLUE SHIELD | Source: Ambulatory Visit | Attending: Orthopedic Surgery | Admitting: Orthopedic Surgery

## 2015-01-18 DIAGNOSIS — Z01818 Encounter for other preprocedural examination: Secondary | ICD-10-CM | POA: Diagnosis not present

## 2015-01-18 LAB — CBC WITH DIFFERENTIAL/PLATELET
Basophils Absolute: 0 10*3/uL (ref 0.0–0.1)
Basophils Relative: 1 % (ref 0–1)
EOS ABS: 0.1 10*3/uL (ref 0.0–0.7)
Eosinophils Relative: 1 % (ref 0–5)
HCT: 44.2 % (ref 39.0–52.0)
HEMOGLOBIN: 14.8 g/dL (ref 13.0–17.0)
LYMPHS PCT: 23 % (ref 12–46)
Lymphs Abs: 1.3 10*3/uL (ref 0.7–4.0)
MCH: 29.7 pg (ref 26.0–34.0)
MCHC: 33.5 g/dL (ref 30.0–36.0)
MCV: 88.6 fL (ref 78.0–100.0)
Monocytes Absolute: 0.3 10*3/uL (ref 0.1–1.0)
Monocytes Relative: 6 % (ref 3–12)
NEUTROS ABS: 3.8 10*3/uL (ref 1.7–7.7)
Neutrophils Relative %: 69 % (ref 43–77)
Platelets: 140 10*3/uL — ABNORMAL LOW (ref 150–400)
RBC: 4.99 MIL/uL (ref 4.22–5.81)
RDW: 12.7 % (ref 11.5–15.5)
WBC: 5.5 10*3/uL (ref 4.0–10.5)

## 2015-01-18 LAB — APTT: aPTT: 30 seconds (ref 24–37)

## 2015-01-18 LAB — SURGICAL PCR SCREEN
MRSA, PCR: NEGATIVE
Staphylococcus aureus: POSITIVE — AB

## 2015-01-18 LAB — PROTIME-INR
INR: 1.03 (ref 0.00–1.49)
Prothrombin Time: 13.6 seconds (ref 11.6–15.2)

## 2015-01-18 LAB — COMPREHENSIVE METABOLIC PANEL
ALT: 19 U/L (ref 0–53)
ANION GAP: 8 (ref 5–15)
AST: 23 U/L (ref 0–37)
Albumin: 3.8 g/dL (ref 3.5–5.2)
Alkaline Phosphatase: 64 U/L (ref 39–117)
BILIRUBIN TOTAL: 0.8 mg/dL (ref 0.3–1.2)
BUN: 14 mg/dL (ref 6–23)
CHLORIDE: 104 mmol/L (ref 96–112)
CO2: 28 mmol/L (ref 19–32)
Calcium: 9.1 mg/dL (ref 8.4–10.5)
Creatinine, Ser: 0.8 mg/dL (ref 0.50–1.35)
GLUCOSE: 93 mg/dL (ref 70–99)
POTASSIUM: 3.9 mmol/L (ref 3.5–5.1)
Sodium: 140 mmol/L (ref 135–145)
TOTAL PROTEIN: 6.3 g/dL (ref 6.0–8.3)

## 2015-01-18 MED ORDER — CHLORHEXIDINE GLUCONATE 4 % EX LIQD: 60.0000 mL | Freq: Once | CUTANEOUS | Status: DC

## 2015-01-18 NOTE — Progress Notes (Signed)
Per patient instructed to stop aspirin and NSAIDS 5 days prior to surgery.

## 2015-01-19 ENCOUNTER — Telehealth: Payer: Self-pay | Admitting: Internal Medicine

## 2015-01-19 NOTE — Telephone Encounter (Signed)
New Message    Patient needs to speak to a nurse regarding having antibotic coverage. Please give patient a call.

## 2015-01-19 NOTE — Telephone Encounter (Signed)
Spoke with pt and he states that he is having orthopaedic right shoulder surgery on 01/28/15. Pt states that in the past Dr. Lovena Le had told pt that he needed to make sure his has antibiotics prior to denture procedures. Pt very concerned that he may need antibiotics for shoulder surgery as well. Pt very persistent that Dr. Lovena Le be made aware of surgery and wants Dr. Lovena Le to advise on whether or not pt should receive antibiotics prior to surgery. Pt worried and really wanted Dr. Lovena Le to advise on this. Will forward to Dr. Lovena Le and his nurse Claiborne Billings for review, advisement and follow up.

## 2015-01-19 NOTE — Telephone Encounter (Signed)
I would defer to his surgeon but with his aortic valve disease I would suggest antibiotics.

## 2015-01-20 NOTE — Telephone Encounter (Signed)
Spoke with pt and informed him that Dr. Lovena Le says that he would defer to his surgeon but with his aortic valve disease he would suggest antibiotics. Pt verbalized understanding and was in agreement with this plan.

## 2015-01-27 MED ORDER — LACTATED RINGERS IV SOLN
INTRAVENOUS | Status: DC
  Administered 2015-01-28 (×2): via INTRAVENOUS

## 2015-01-27 MED ORDER — CEFAZOLIN SODIUM-DEXTROSE 2-3 GM-% IV SOLR
2.0000 g | INTRAVENOUS | Status: AC
  Administered 2015-01-28: 2 g via INTRAVENOUS

## 2015-01-28 ENCOUNTER — Encounter (HOSPITAL_COMMUNITY)
Admission: RE | Disposition: A | Discharge status: Home / Self Care | Payer: Self-pay | Source: Ambulatory Visit | Attending: Orthopedic Surgery

## 2015-01-28 ENCOUNTER — Encounter (HOSPITAL_COMMUNITY): Payer: Self-pay

## 2015-01-28 ENCOUNTER — Ambulatory Visit (HOSPITAL_COMMUNITY): Payer: BLUE CROSS/BLUE SHIELD | Admitting: Vascular Surgery

## 2015-01-28 ENCOUNTER — Ambulatory Visit (HOSPITAL_COMMUNITY)
Admission: RE | Admit: 2015-01-28 | Discharge: 2015-01-28 | Disposition: A | Discharge status: Home / Self Care | Payer: BLUE CROSS/BLUE SHIELD | Source: Ambulatory Visit | Attending: Orthopedic Surgery | Admitting: Orthopedic Surgery

## 2015-01-28 ENCOUNTER — Ambulatory Visit (HOSPITAL_COMMUNITY): Payer: BLUE CROSS/BLUE SHIELD | Admitting: Certified Registered"

## 2015-01-28 DIAGNOSIS — E780 Pure hypercholesterolemia: Secondary | ICD-10-CM | POA: Diagnosis not present

## 2015-01-28 DIAGNOSIS — M75101 Unspecified rotator cuff tear or rupture of right shoulder, not specified as traumatic: Secondary | ICD-10-CM | POA: Insufficient documentation

## 2015-01-28 DIAGNOSIS — Z86010 Personal history of colonic polyps: Secondary | ICD-10-CM | POA: Diagnosis not present

## 2015-01-28 DIAGNOSIS — Z87891 Personal history of nicotine dependence: Secondary | ICD-10-CM | POA: Diagnosis not present

## 2015-01-28 DIAGNOSIS — M13811 Other specified arthritis, right shoulder: Secondary | ICD-10-CM | POA: Insufficient documentation

## 2015-01-28 DIAGNOSIS — F419 Anxiety disorder, unspecified: Secondary | ICD-10-CM | POA: Insufficient documentation

## 2015-01-28 DIAGNOSIS — I350 Nonrheumatic aortic (valve) stenosis: Secondary | ICD-10-CM | POA: Diagnosis not present

## 2015-01-28 DIAGNOSIS — M24111 Other articular cartilage disorders, right shoulder: Secondary | ICD-10-CM | POA: Insufficient documentation

## 2015-01-28 DIAGNOSIS — M94211 Chondromalacia, right shoulder: Secondary | ICD-10-CM | POA: Diagnosis not present

## 2015-01-28 DIAGNOSIS — I10 Essential (primary) hypertension: Secondary | ICD-10-CM | POA: Diagnosis not present

## 2015-01-28 DIAGNOSIS — Z7982 Long term (current) use of aspirin: Secondary | ICD-10-CM | POA: Insufficient documentation

## 2015-01-28 DIAGNOSIS — Z85820 Personal history of malignant melanoma of skin: Secondary | ICD-10-CM | POA: Diagnosis not present

## 2015-01-28 DIAGNOSIS — M7541 Impingement syndrome of right shoulder: Secondary | ICD-10-CM | POA: Insufficient documentation

## 2015-01-28 HISTORY — PX: SHOULDER ARTHROSCOPY WITH SUBACROMIAL DECOMPRESSION AND BICEP TENDON REPAIR: SHX5689

## 2015-01-28 SURGERY — SHOULDER ARTHROSCOPY WITH SUBACROMIAL DECOMPRESSION AND BICEP TENDON REPAIR
Anesthesia: Regional | Site: Shoulder | Laterality: Right

## 2015-01-28 MED ORDER — NAPROXEN 500 MG PO TABS: 500.0000 mg | ORAL_TABLET | Freq: Two times a day (BID) | ORAL | Status: DC

## 2015-01-28 MED ORDER — MIDAZOLAM HCL 5 MG/5ML IJ SOLN
INTRAMUSCULAR | Status: DC | PRN
  Administered 2015-01-28: 2 mg via INTRAVENOUS

## 2015-01-28 MED ORDER — OXYCODONE-ACETAMINOPHEN 5-325 MG PO TABS: 1.0000 | ORAL_TABLET | ORAL | Status: DC | PRN

## 2015-01-28 MED ORDER — BUPIVACAINE-EPINEPHRINE (PF) 0.5% -1:200000 IJ SOLN
INTRAMUSCULAR | Status: DC | PRN
  Administered 2015-01-28: 30 mL via PERINEURAL

## 2015-01-28 MED ORDER — PROPOFOL 10 MG/ML IV BOLUS
INTRAVENOUS | Status: DC | PRN
  Administered 2015-01-28: 180 mg via INTRAVENOUS

## 2015-01-28 MED ORDER — MIDAZOLAM HCL 2 MG/2ML IJ SOLN
INTRAMUSCULAR | Status: AC
  Filled 2015-01-28: qty 2

## 2015-01-28 MED ORDER — ONDANSETRON HCL 4 MG/2ML IJ SOLN: 4.0000 mg | Freq: Four times a day (QID) | INTRAMUSCULAR | Status: DC | PRN

## 2015-01-28 MED ORDER — LIDOCAINE HCL (CARDIAC) 20 MG/ML IV SOLN
INTRAVENOUS | Status: DC | PRN
  Administered 2015-01-28: 60 mg via INTRAVENOUS

## 2015-01-28 MED ORDER — PROPOFOL 10 MG/ML IV BOLUS
INTRAVENOUS | Status: AC
  Filled 2015-01-28: qty 20

## 2015-01-28 MED ORDER — ONDANSETRON HCL 4 MG/2ML IJ SOLN
INTRAMUSCULAR | Status: DC | PRN
  Administered 2015-01-28: 4 mg via INTRAVENOUS

## 2015-01-28 MED ORDER — OXYCODONE HCL 5 MG PO TABS
5.0000 mg | ORAL_TABLET | Freq: Once | ORAL | Status: AC | PRN
  Administered 2015-01-28: 5 mg via ORAL

## 2015-01-28 MED ORDER — ROCURONIUM BROMIDE 50 MG/5ML IV SOLN
INTRAVENOUS | Status: AC
  Filled 2015-01-28: qty 1

## 2015-01-28 MED ORDER — OXYCODONE HCL 5 MG/5ML PO SOLN: 5.0000 mg | Freq: Once | ORAL | Status: AC | PRN

## 2015-01-28 MED ORDER — FENTANYL CITRATE (PF) 100 MCG/2ML IJ SOLN
INTRAMUSCULAR | Status: DC | PRN
  Administered 2015-01-28: 25 ug via INTRAVENOUS
  Administered 2015-01-28: 50 ug via INTRAVENOUS
  Administered 2015-01-28: 100 ug via INTRAVENOUS

## 2015-01-28 MED ORDER — PHENYLEPHRINE 40 MCG/ML (10ML) SYRINGE FOR IV PUSH (FOR BLOOD PRESSURE SUPPORT)
PREFILLED_SYRINGE | INTRAVENOUS | Status: AC
  Filled 2015-01-28: qty 10

## 2015-01-28 MED ORDER — SODIUM CHLORIDE 0.9 % IJ SOLN
INTRAMUSCULAR | Status: AC
  Filled 2015-01-28: qty 10

## 2015-01-28 MED ORDER — OXYCODONE HCL 5 MG PO TABS
ORAL_TABLET | ORAL | Status: AC
  Filled 2015-01-28: qty 1

## 2015-01-28 MED ORDER — GLYCOPYRROLATE 0.2 MG/ML IJ SOLN
INTRAMUSCULAR | Status: DC | PRN
  Administered 2015-01-28: 0.4 mg via INTRAVENOUS

## 2015-01-28 MED ORDER — EPHEDRINE SULFATE 50 MG/ML IJ SOLN
INTRAMUSCULAR | Status: AC
  Filled 2015-01-28: qty 1

## 2015-01-28 MED ORDER — ROCURONIUM BROMIDE 100 MG/10ML IV SOLN
INTRAVENOUS | Status: DC | PRN
  Administered 2015-01-28: 40 mg via INTRAVENOUS

## 2015-01-28 MED ORDER — SODIUM CHLORIDE 0.9 % IR SOLN
Status: DC | PRN
  Administered 2015-01-28: 3000 mL

## 2015-01-28 MED ORDER — NEOSTIGMINE METHYLSULFATE 10 MG/10ML IV SOLN
INTRAVENOUS | Status: DC | PRN
  Administered 2015-01-28: 3 mg via INTRAVENOUS

## 2015-01-28 MED ORDER — FENTANYL CITRATE (PF) 250 MCG/5ML IJ SOLN
INTRAMUSCULAR | Status: AC
  Filled 2015-01-28: qty 5

## 2015-01-28 MED ORDER — SUCCINYLCHOLINE CHLORIDE 20 MG/ML IJ SOLN
INTRAMUSCULAR | Status: AC
  Filled 2015-01-28: qty 1

## 2015-01-28 MED ORDER — HYDROMORPHONE HCL 1 MG/ML IJ SOLN: 0.2500 mg | INTRAMUSCULAR | Status: DC | PRN

## 2015-01-28 MED ORDER — LIDOCAINE HCL (CARDIAC) 20 MG/ML IV SOLN
INTRAVENOUS | Status: AC
  Filled 2015-01-28: qty 5

## 2015-01-28 MED ORDER — DIAZEPAM 5 MG PO TABS: 2.5000 mg | ORAL_TABLET | Freq: Four times a day (QID) | ORAL | Status: DC | PRN

## 2015-01-28 SURGICAL SUPPLY — 63 items
BLADE CUTTER GATOR 3.5 (BLADE) ×3 IMPLANT
BLADE GREAT WHITE 4.2 (BLADE) ×2 IMPLANT
BLADE GREAT WHITE 4.2MM (BLADE) ×1
BLADE SURG 11 STRL SS (BLADE) ×3 IMPLANT
BOOTCOVER CLEANROOM LRG (PROTECTIVE WEAR) ×6 IMPLANT
BUR 3.5 LG SPHERICAL (BURR) IMPLANT
BUR OVAL 4.0 (BURR) ×3 IMPLANT
BURR 3.5 LG SPHERICAL (BURR)
BURR 3.5MM LG SPHERICAL (BURR)
CANISTER SUCT LVC 12 LTR MEDI- (MISCELLANEOUS) ×3 IMPLANT
CANNULA ACUFLEX KIT 5X76 (CANNULA) ×3 IMPLANT
CANNULA DRILOCK 5.0MMX75MM (CANNULA)
CANNULA DRILOCK 5.0X75 (CANNULA) IMPLANT
CLOSURE WOUND 1/2 X4 (GAUZE/BANDAGES/DRESSINGS) ×1
CONNECTOR 5 IN 1 STRAIGHT STRL (MISCELLANEOUS) ×3 IMPLANT
DRAPE INCISE 23X17 IOBAN STRL (DRAPES) ×2
DRAPE INCISE IOBAN 23X17 STRL (DRAPES) ×1 IMPLANT
DRAPE INCISE IOBAN 66X45 STRL (DRAPES) ×3 IMPLANT
DRAPE STERI 35X30 U-POUCH (DRAPES) ×3 IMPLANT
DRAPE SURG 17X11 SM STRL (DRAPES) ×3 IMPLANT
DRAPE U-SHAPE 47X51 STRL (DRAPES) ×3 IMPLANT
DRSG PAD ABDOMINAL 8X10 ST (GAUZE/BANDAGES/DRESSINGS) ×3 IMPLANT
DURAPREP 26ML APPLICATOR (WOUND CARE) ×6 IMPLANT
ELECT REM PT RETURN 9FT ADLT (ELECTROSURGICAL) ×3
ELECTRODE REM PT RTRN 9FT ADLT (ELECTROSURGICAL) ×1 IMPLANT
GAUZE SPONGE 4X4 12PLY STRL (GAUZE/BANDAGES/DRESSINGS) ×3 IMPLANT
GLOVE BIO SURGEON STRL SZ7.5 (GLOVE) ×3 IMPLANT
GLOVE BIO SURGEON STRL SZ8 (GLOVE) ×3 IMPLANT
GLOVE EUDERMIC 7 POWDERFREE (GLOVE) ×3 IMPLANT
GLOVE SS BIOGEL STRL SZ 7.5 (GLOVE) ×1 IMPLANT
GLOVE SUPERSENSE BIOGEL SZ 7.5 (GLOVE) ×2
GOWN STRL REUS W/ TWL LRG LVL3 (GOWN DISPOSABLE) ×1 IMPLANT
GOWN STRL REUS W/ TWL XL LVL3 (GOWN DISPOSABLE) ×4 IMPLANT
GOWN STRL REUS W/TWL LRG LVL3 (GOWN DISPOSABLE) ×2
GOWN STRL REUS W/TWL XL LVL3 (GOWN DISPOSABLE) ×8
KIT BASIN OR (CUSTOM PROCEDURE TRAY) ×3 IMPLANT
KIT BIO-TENODESIS 3X8 DISP (MISCELLANEOUS)
KIT INSRT BABSR STRL DISP BTN (MISCELLANEOUS) IMPLANT
KIT ROOM TURNOVER OR (KITS) ×3 IMPLANT
KIT SHOULDER TRACTION (DRAPES) ×3 IMPLANT
MANIFOLD NEPTUNE II (INSTRUMENTS) ×3 IMPLANT
NDL SUT 6 .5 CRC .975X.05 MAYO (NEEDLE) IMPLANT
NEEDLE MAYO TAPER (NEEDLE)
NEEDLE SPNL 18GX3.5 QUINCKE PK (NEEDLE) ×3 IMPLANT
NS IRRIG 1000ML POUR BTL (IV SOLUTION) ×3 IMPLANT
PACK SHOULDER (CUSTOM PROCEDURE TRAY) ×3 IMPLANT
PAD ARMBOARD 7.5X6 YLW CONV (MISCELLANEOUS) ×6 IMPLANT
SET ARTHROSCOPY TUBING (MISCELLANEOUS) ×2
SET ARTHROSCOPY TUBING LN (MISCELLANEOUS) ×1 IMPLANT
SLING ARM LRG ADULT FOAM STRAP (SOFTGOODS) IMPLANT
SLING ARM MED ADULT FOAM STRAP (SOFTGOODS) ×3 IMPLANT
SPONGE GAUZE 4X4 12PLY STER LF (GAUZE/BANDAGES/DRESSINGS) ×3 IMPLANT
SPONGE LAP 4X18 X RAY DECT (DISPOSABLE) ×3 IMPLANT
STRIP CLOSURE SKIN 1/2X4 (GAUZE/BANDAGES/DRESSINGS) ×2 IMPLANT
SUT MNCRL AB 3-0 PS2 18 (SUTURE) ×3 IMPLANT
SUT PDS AB 0 CT 36 (SUTURE) IMPLANT
SUT RETRIEVER GRASP 30 DEG (SUTURE) IMPLANT
SYR 20CC LL (SYRINGE) ×3 IMPLANT
TAPE PAPER 3X10 WHT MICROPORE (GAUZE/BANDAGES/DRESSINGS) ×3 IMPLANT
TOWEL OR 17X24 6PK STRL BLUE (TOWEL DISPOSABLE) ×3 IMPLANT
TOWEL OR 17X26 10 PK STRL BLUE (TOWEL DISPOSABLE) ×3 IMPLANT
WAND SUCTION MAX 4MM 90S (SURGICAL WAND) ×3 IMPLANT
WATER STERILE IRR 1000ML POUR (IV SOLUTION) ×3 IMPLANT

## 2015-01-28 NOTE — Transfer of Care (Signed)
Immediate Anesthesia Transfer of Care Note  Patient: WELDON NOURI  Procedure(s) Performed: Procedure(s): RIGHT SHOULDER ARTHROSCOPY BICEP TENODESIS LABRAL DEBRIDEMENT SUBACROMIAL DECOMPRESSION  DISTAL CLAVICLE RESECTION  (Right)  Patient Location: PACU  Anesthesia Type:GA combined with regional for post-op pain  Level of Consciousness: awake, alert , oriented and patient cooperative  Airway & Oxygen Therapy: Patient Spontanous Breathing and Patient connected to nasal cannula oxygen  Post-op Assessment: Report given to RN and Post -op Vital signs reviewed and stable  Post vital signs: Reviewed and stable  Last Vitals:  Filed Vitals:   01/28/15 0915  BP: 158/59  Pulse: 56  Temp: 36.1 C  Resp: 13    Complications: No apparent anesthesia complications

## 2015-01-28 NOTE — Anesthesia Procedure Notes (Addendum)
Anesthesia Regional Block:  Interscalene brachial plexus block  Pre-Anesthetic Checklist: ,, timeout performed, Correct Patient, Correct Site, Correct Laterality, Correct Procedure, Correct Position, site marked, Risks and benefits discussed,  Surgical consent,  Pre-op evaluation,  At surgeon's request and post-op pain management  Laterality: Right  Prep: chloraprep       Needles:  Injection technique: Single-shot  Needle Type: Echogenic Stimulator Needle     Needle Length: 5cm 5 cm Needle Gauge: 22 and 22 G    Additional Needles:  Procedures: ultrasound guided (picture in chart) and nerve stimulator Interscalene brachial plexus block  Nerve Stimulator or Paresthesia:  Response: biceps flexion, 0.45 mA,   Additional Responses:   Narrative:  Start time: 01/28/2015 7:01 AM End time: 01/28/2015 7:09 AM Injection made incrementally with aspirations every 5 mL.  Performed by: Personally  Anesthesiologist: HODIERNE, ADAM  Additional Notes: Functioning IV was confirmed and monitors were applied.  A 74mm 22ga Arrow echogenic stimulator needle was used. Sterile prep and drape,hand hygiene and sterile gloves were used.  Negative aspiration and negative test dose prior to incremental administration of local anesthetic. The patient tolerated the procedure well.  Ultrasound guidance: relevent anatomy identified, needle position confirmed, local anesthetic spread visualized around nerve(s), vascular puncture avoided.  Image printed for medical record.    Procedure Name: Intubation Date/Time: 01/28/2015 7:42 AM Performed by: Julian Reil Pre-anesthesia Checklist: Emergency Drugs available, Patient identified, Suction available and Patient being monitored Patient Re-evaluated:Patient Re-evaluated prior to inductionOxygen Delivery Method: Circle system utilized Preoxygenation: Pre-oxygenation with 100% oxygen Intubation Type: IV induction Ventilation: Mask ventilation without  difficulty and Oral airway inserted - appropriate to patient size Laryngoscope Size: Mac and 4 Grade View: Grade II Tube type: Oral Tube size: 7.5 mm Number of attempts: 1 Airway Equipment and Method: Stylet Placement Confirmation: ETT inserted through vocal cords under direct vision,  positive ETCO2 and breath sounds checked- equal and bilateral Secured at: 21 cm Tube secured with: Tape Dental Injury: Teeth and Oropharynx as per pre-operative assessment

## 2015-01-28 NOTE — Anesthesia Postprocedure Evaluation (Signed)
  Anesthesia Post-op Note  Patient: Leon Gibson  Procedure(s) Performed: Procedure(s): RIGHT SHOULDER ARTHROSCOPY BICEP TENODESIS LABRAL DEBRIDEMENT SUBACROMIAL DECOMPRESSION  DISTAL CLAVICLE RESECTION  (Right)  Patient Location: PACU  Anesthesia Type: General, Regional   Level of Consciousness: awake, alert  and oriented  Airway and Oxygen Therapy: Patient Spontanous Breathing  Post-op Pain: mild  Post-op Assessment: Post-op Vital signs reviewed  Post-op Vital Signs: Reviewed  Last Vitals:  Filed Vitals:   01/28/15 1118  BP: 147/56  Pulse: 57  Temp:   Resp: 16    Complications: No apparent anesthesia complications

## 2015-01-28 NOTE — H&P (Signed)
Leon Gibson    Chief Complaint: RIGHT SHOULDER IMPINGEMENT  HPI: The patient is a 64 y.o. male with chronic right shoulder pain  Past Medical History  Diagnosis Date  . Chronic rhinitis   . Diverticulitis of colon (without mention of hemorrhage)   . Aortic valve disorders   . Colon polyp   . Melanoma     L paralumbar 2003, no djuvant rx  . Weight gain   . Healthcare maintenance   . Rheumatic heart disease   . Palpitations   . Sinus bradycardia     Past Surgical History  Procedure Laterality Date  . Melonoma removal      one level 2 melanoma with a single level 2 melanoma excised from his low back in aMay of 2003  . Colonoscopy      8/05 and 4/11  . Tonsillectomy    . Hernia repair Right     inguinal - as an infant     Family History  Problem Relation Age of Onset  . Depression Brother   . Colon polyps Mother     Social History:  reports that he has quit smoking. He has never used smokeless tobacco. He reports that he drinks alcohol. He reports that he does not use illicit drugs.  Allergies: No Known Allergies  Medications Prior to Admission  Medication Sig Dispense Refill  . aspirin 81 MG tablet Take 81 mg by mouth daily.      . fluticasone (FLONASE) 50 MCG/ACT nasal spray SPRAY 1 SPRAY INTO THE NOSE TWICE A DAY 16 g 11  . ibuprofen (ADVIL,MOTRIN) 200 MG tablet Take 600 mg by mouth every 6 (six) hours as needed (pain).    . Multiple Vitamin (MULTIVITAMIN) capsule Take 1 capsule by mouth daily.      . ramipril (ALTACE) 5 MG capsule TAKE 1 CAPSULE (5 MG TOTAL) BY MOUTH DAILY. 90 capsule 1  . tadalafil (CIALIS) 10 MG tablet Take 1 tablet (10 mg total) by mouth daily as needed for erectile dysfunction. 20 tablet 0  . amoxicillin (AMOXIL) 500 MG capsule Take 4 pills 1 hour prior to dental procedure 4 capsule 3     Physical Exam: right shoulder with painful and restricted motion as noted at recent office visits  Vitals  Temp:  [98 F (36.7 C)] 98 F (36.7 C)  (04/28 6767) Pulse Rate:  [54-71] 66 (04/28 0724) Resp:  [20] 20 (04/28 0613) BP: (146)/(50) 146/50 mmHg (04/28 0613) SpO2:  [100 %] 100 % (04/28 0613) Weight:  [88.565 kg (195 lb 4 oz)] 88.565 kg (195 lb 4 oz) (04/28 2094)  Assessment/Plan  Impression: RIGHT SHOULDER IMPINGEMENT   Plan of Action: Procedure(s): RIGHT SHOULDER ARTHROSCOPY BICEP TENODESIS LABRAL DEBRIDEMENT SUBACROMIAL DECOMPRESSION  DISTAL CLAVICLE RESECTION   Santiago Stenzel M 01/28/2015, 7:25 AM Contact # [phone]

## 2015-01-28 NOTE — Anesthesia Preprocedure Evaluation (Signed)
Anesthesia Evaluation  Patient identified by MRN, date of birth, ID band Patient awake    Reviewed: Allergy & Precautions, NPO status , Patient's Chart, lab work & pertinent test results  Airway Mallampati: II   Neck ROM: full    Dental   Pulmonary former smoker,  breath sounds clear to auscultation        Cardiovascular hypertension, + Valvular Problems/Murmurs AS Rhythm:regular Rate:Normal  Moderate aortic stenosis   Neuro/Psych  Headaches, Anxiety    GI/Hepatic   Endo/Other    Renal/GU      Musculoskeletal   Abdominal   Peds  Hematology   Anesthesia Other Findings   Reproductive/Obstetrics                             Anesthesia Physical Anesthesia Plan  ASA: III  Anesthesia Plan: General and Regional   Post-op Pain Management: MAC Combined w/ Regional for Post-op pain   Induction: Intravenous  Airway Management Planned: Oral ETT  Additional Equipment:   Intra-op Plan:   Post-operative Plan: Extubation in OR  Informed Consent: I have reviewed the patients History and Physical, chart, labs and discussed the procedure including the risks, benefits and alternatives for the proposed anesthesia with the patient or authorized representative who has indicated his/her understanding and acceptance.     Plan Discussed with: CRNA, Anesthesiologist and Surgeon  Anesthesia Plan Comments:         Anesthesia Quick Evaluation

## 2015-01-28 NOTE — Discharge Instructions (Signed)
° °  Kevin M. Supple, M.D., F.A.A.O.S. °Orthopaedic Surgery °Specializing in Arthroscopic and Reconstructive °Surgery of the Shoulder and Knee °[phone] °[address] - [place], [place] [address] - Fax [phone] ° ° °POST-OP SHOULDER ARTHROSCOPY INSTRUCTIONS ° °1. Call the office at [phone] to schedule your first post-op appointment 7-10 days from the date of your surgery. ° °2. Leave the steri-strips in place over your incisions when performing dressing changes and showering. You may remove your dressings and begin showering 72 hours from surgery. You can expect drainage that is clear to bloody in nature that occasionally will soak through your dressings. If this occurs go ahead and perform a dressing change. The drainage should lessen daily and when there is no drainage from your incisions feel free to go without a dressing. ° °3. Wear your sling for comfort. You may come out of your sling for ad lib activity and even decide not to use the sling at all. If you find you are more comfortable in your sling, make sure you come out of your sling at least 3-4 times a day to do the exercises that are included below. ° °4. Range of motion to your elbow, wrist, and hand are encouraged 3-5 times daily. Exercise to your hand and fingers helps to reduce swelling you may experience. ° °5. Utilize ice to the shoulder 3-4 times minimum a day and additionally if you are experiencing pain. ° °6. You may drive when safely off narcotics and muscle relaxants. ° °7. If you had a block pre-operatively to provide post-op pain relief you may want to go ahead and begin utilizing your pain meds as your arm begins to wake up. Blocks can sometimes last up to 16-18 hours. If you are still pain-free prior to going to bed you may want to strongly consider taking a pain medication to avoid being awakened in the night with the onset of pain. A muscle relaxant is also provided for you should you experience muscle spasms. It  is recommended that if you are experiencing pain that your pain medication alone is not controlling, add the muscle relaxant along with the pain medication which can give additional pain relief. The first one to two days is generally the most severe of your pain and then should gradually decrease. As your pain lessens it is recommended that you decrease your use of the pain medications to an "as needed basis" only and to always comply with the recommended dosages of the pain medications. ° °8. Pain medications can produce constipation along with their use. If you experience this, the use of an over the counter stool softener or laxative daily is recommended.  ° °9. For additional questions or concerns, please do not hesitate to call the office. If after hours there is an answering service to forward your concerns to the physician on call. ° ° °POST-OP EXERCISES ° °The pendulum exercises should be performed while bending at the waist as far over as possible thereby letting gravity do the work for you. ° °Range of Motion Exercises: Pendulum (circular) ° °Repeat 20 times. Do 3 sessions per day. ° ° ° ° °Range of Motion Exercises: Pendulum (side-to-side) ° °Repeat 20 times. Do 3 sessions per day. ° ° ° °Range of Motion Exercises (self-stretching activities): ° °Slide arm up wall with palm toward you, moving closer to the wall. Hold for 5 seconds. ° °Repeat 10 times. Do 3 sessions per day. ° ° ° ° ° ° °

## 2015-01-28 NOTE — Op Note (Signed)
01/28/2015  9:06 AM  PATIENT:   Leon Gibson  64 y.o. male  PRE-OPERATIVE DIAGNOSIS:  RIGHT SHOULDER IMPINGEMENT ,AC joint OA  POST-OPERATIVE DIAGNOSIS:  Glenohumeral arthritis, loose bodies, labral tear, partial RCT, impingement, AC joint OA  PROCEDURE:  RSA, chondroplasty, labral debridement, LB removal, RC debridement, SAD, DCR  SURGEON:  Jessee Newnam, Metta Clines M.D.  ASSISTANTS: Shuford pac   ANESTHESIA:   GET + ISB  EBL: min  SPECIMEN:  none  Drains: none   PATIENT DISPOSITION:  PACU - hemodynamically stable.    PLAN OF CARE: Discharge to home after PACU  Dictation# [id number]   Contact # [phone]

## 2015-01-29 ENCOUNTER — Encounter (HOSPITAL_COMMUNITY): Payer: Self-pay | Admitting: Orthopedic Surgery

## 2015-01-29 NOTE — Op Note (Signed)
Leon Gibson, Leon Gibson NO.:  [id number]  MEDICAL RECORD NO.:  [id number]  LOCATION:  [facility]                         FACILITY:  [facility]  PHYSICIAN:  Metta Clines. Emagene Merfeld, M.D.  DATE OF BIRTH:  April 05, 1951  DATE OF PROCEDURE:  01/28/2015 DATE OF DISCHARGE:  01/28/2015                              OPERATIVE REPORT   PREOPERATIVE DIAGNOSES: 1. Chronic right shoulder impingement. 2. 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  POSTOPERATIVE DIAGNOSES: 1. Chronic right shoulder impingement. 2. 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 3. Glenohumeral arthritis with advanced chondromalacia. 4. Multiple cartilaginous loose bodies. 5. Degenerative labral tear. 6. Partial articular rotator cuff tear.  PROCEDURES: 1. Right shoulder examination under anesthesia. 2. Right shoulder glenohumeral joint diagnostic arthroscopy. 3. Chondroplasty of the humeral head and the glenoid. 4. Removal of multiple cartilaginous loose bodies. 5. Labral debridement. 6. Rotator cuff debridement. 7. Arthroscopic subacromial decompression and bursectomy. 8. Arthroscopic distal clavicle resection.  SURGEON:  Metta Clines. Daemyn Gariepy, M.D.  Terrence DupontOlivia Mackie A. Shuford, P.A.-C.  ANESTHESIA:  General endotracheal as interscalene block.  ESTIMATED BLOOD LOSS:  Minimal.  DRAINS:  None.  HISTORY:  Mr. Tillery is a 64 year old gentleman who has had chronic and progressive increasing activity-related right shoulder pain, which has been refractory to prolonged and multiple attempts to conservative management.  His preoperative MRI scan suggests some tendinosis of the rotator cuff with bony impingement and AC joint degenerative changes. Due to his ongoing pain and functional limitations and failure to respond to conservative management, he was brought to the operating room at this time for planned right shoulder arthroscopy as described below.  Preoperatively, I  counseled Mr. Kienast regarding treatment options as well as risks versus benefits thereof.  Possible surgical complications were all reviewed including potential for bleeding, infection, neurovascular injury, persistent pain, loss of motion, anesthetic complication, and possible need for additional surgery.  He understands and accepts and agrees with our planned procedure.  PROCEDURE IN DETAIL:  After undergoing routine preop evaluation, the patient received prophylactic antibiotics and an interscalene block was established in the holding area by the Anesthesia Department.  Placed supine on the operating table, underwent smooth induction of a general endotracheal anesthesia.  Turned to the left lateral decubitus position on a beanbag and appropriately padded and protected.  Right shoulder examination under anesthesia revealed a very modest restrictions in mobility achieving 170 degrees of forward elevation, 80 of degrees rotation.  Right arm suspended at 70 degrees of abduction with 10 pounds of traction.  Then, the right shoulder girdle region was sterilely prepped and draped in standard fashion.  Time-out was called.  Posterior portal was established in the glenohumeral joint and anterior portal was established under direct visualization.  Immediately, it was evident for relatively advanced glenohumeral joint arthrosis/chondromalacia.  We unfortunately found evidence for a full-thickness chondral defect over the majority of the central aspect of the humeral head, measured approximately 3 x 3 cm with a loose rim of cartilage flaps at the margins.  We went ahead and debrided the loose flaps of cartilage creating a smooth contour between the central area of exposed bone and the remaining peripheral aspect of cartilage.  Once this was  all completed on the humeral head, smoothed into appropriate contour, we addressed diffuse grade 3 chondromalacia of the glenoid.  In addition, there was a  circumferential degenerative labral tear, which we debrided with a shaver.  There was some moderate synovitis.  We did perform a limited synovectomy as well.  The biceps tendon had normal caliber with no obvious proximal and distal instability and so, no instability patterns were noted in the glenohumeral joint.  There was, however, partial articular rotator cuff tear involving the distal supraspinatus, which we debrided with shaver.  We estimated that this was perhaps no more than 15% to 20% of the thickness of the tendon, although we did pass the tag suture of 0 PDS at this point to confirm on the bursal side.  We then completed the irrigation and debridement of the glenohumeral joint, obtained final hemostasis.  Fluid and instruments were then removed.  The arm was then dropped down to 30 degrees of abduction with the arthroscope introduced into the subacromial space of the posterior portal and a direct lateral portal was established in the subacromial space.  Dense bursal tissue with multiple inflammatory changes were noted and bursectomy was performed with a shaver and a Stryker wand.  The wand was then used to remove the periosteum from the undersurface of the anterior half of the acromion.  A subacromial decompression was performed with a bur creating a type 1 morphology, needing to remove all 2-3 mm of bone anteriorly along the acromial margin.  We then carefully inspected and probed the bursal surface of the rotator cuff particularly about the tag suture and did not identify any significant attenuation of defects.  The tag suture was removed.  At this point, I established a portal directly anterior to the distal clavicle and the distal clavicle resection was performed with a bur. Care was taken to confirm visualization of entire circumference of the distal clavicle to ensure adequate removal of bone.  We then completed the subacromial/subdeltoid bursectomy.  Final hemostasis was  obtained. Fluid and instruments were removed.  The portals were closed with Monocryl and Steri-Strips, a dry dressing taped at the right shoulder. The right arm was then placed in a sling.  The patient was awakened, extubated, and taken to the recovery room in stable condition.  Tracy A. Shuford, PA-C was used as an Environmental consultant throughout this case, essential for help with positioning of the patient, positioning of the extremity, management of the arthroscopic equipment, tissue manipulations, suture management, wound closure, and intraoperative decision making.     Metta Clines. Britania Shreeve, M.D.     KMS/MEDQ  D:  01/28/2015  T:  01/29/2015  Job:  [id number]

## 2015-02-04 ENCOUNTER — Encounter: Payer: Self-pay | Admitting: Internal Medicine

## 2015-02-04 DIAGNOSIS — M25511 Pain in right shoulder: Secondary | ICD-10-CM | POA: Insufficient documentation

## 2015-03-11 ENCOUNTER — Telehealth: Payer: Self-pay | Admitting: Internal Medicine

## 2015-03-11 DIAGNOSIS — I350 Nonrheumatic aortic (valve) stenosis: Secondary | ICD-10-CM

## 2015-03-11 NOTE — Telephone Encounter (Signed)
New Message  Pt called to sch appt. Req ECHO. Please put in orders

## 2015-03-11 NOTE — Telephone Encounter (Signed)
Will order echo and get a follow up after with Dr Lovena Le

## 2015-03-30 ENCOUNTER — Other Ambulatory Visit (HOSPITAL_COMMUNITY): Payer: BLUE CROSS/BLUE SHIELD

## 2015-03-30 ENCOUNTER — Ambulatory Visit (HOSPITAL_COMMUNITY)
Admission: RE | Admit: 2015-03-30 | Discharge: 2015-03-30 | Disposition: A | Discharge status: Home / Self Care | Payer: BLUE CROSS/BLUE SHIELD | Source: Ambulatory Visit | Attending: Cardiovascular Disease | Admitting: Cardiovascular Disease

## 2015-03-30 DIAGNOSIS — I351 Nonrheumatic aortic (valve) insufficiency: Secondary | ICD-10-CM | POA: Diagnosis not present

## 2015-03-30 DIAGNOSIS — I350 Nonrheumatic aortic (valve) stenosis: Secondary | ICD-10-CM

## 2015-03-30 DIAGNOSIS — I358 Other nonrheumatic aortic valve disorders: Secondary | ICD-10-CM | POA: Diagnosis present

## 2015-04-08 ENCOUNTER — Ambulatory Visit (INDEPENDENT_AMBULATORY_CARE_PROVIDER_SITE_OTHER): Payer: BLUE CROSS/BLUE SHIELD | Admitting: Internal Medicine

## 2015-04-08 ENCOUNTER — Encounter: Payer: Self-pay | Admitting: Internal Medicine

## 2015-04-08 VITALS — BP 142/66 | HR 56 | Ht 71.0 in | Wt 193.0 lb

## 2015-04-08 DIAGNOSIS — I350 Nonrheumatic aortic (valve) stenosis: Secondary | ICD-10-CM | POA: Diagnosis not present

## 2015-04-08 DIAGNOSIS — I10 Essential (primary) hypertension: Secondary | ICD-10-CM

## 2015-04-08 DIAGNOSIS — I351 Nonrheumatic aortic (valve) insufficiency: Secondary | ICD-10-CM | POA: Diagnosis not present

## 2015-04-08 DIAGNOSIS — I359 Nonrheumatic aortic valve disorder, unspecified: Secondary | ICD-10-CM | POA: Diagnosis not present

## 2015-04-08 NOTE — Assessment & Plan Note (Signed)
He has moderate AI and AS by echo. He is asymptomatic. He dimensions have increased only slightly. We will have him return in a year with repeat echo at that time. No indication for surgery at this time.

## 2015-04-08 NOTE — Progress Notes (Signed)
HPI Mr. Brandel returns today for followup. He is a 64 year old man with a history of aortic insufficiency and moderate aortic stenosis and sinus bradycardia, along with a history of hypertension which has been well-controlled. The patient has done well in the interim.  A recent 2-D echo demonstrates moderate aortic insufficiency and stenosis and no LV dysfunction. His chamber sizes were not appreciably enlarged. His activity is good he denies chest pain or shortness of breath. No syncope.  No Known Allergies   Current Outpatient Prescriptions  Medication Sig Dispense Refill  . amoxicillin (AMOXIL) 500 MG capsule Take 4 pills 1 hour prior to dental procedure 4 capsule 3  . aspirin 81 MG tablet Take 81 mg by mouth daily.      . fluticasone (FLONASE) 50 MCG/ACT nasal spray SPRAY 1 SPRAY INTO THE NOSE TWICE A DAY 16 g 11  . Multiple Vitamin (MULTIVITAMIN) capsule Take 1 capsule by mouth daily.      . ramipril (ALTACE) 5 MG capsule TAKE 1 CAPSULE (5 MG TOTAL) BY MOUTH DAILY. 90 capsule 1  . tadalafil (CIALIS) 10 MG tablet Take 1 tablet (10 mg total) by mouth daily as needed for erectile dysfunction. 20 tablet 0   No current facility-administered medications for this visit.     Past Medical History  Diagnosis Date  . Chronic rhinitis   . Diverticulitis of colon (without mention of hemorrhage)   . Aortic valve disorders   . Colon polyp   . Melanoma     L paralumbar 2003, no djuvant rx  . Weight gain   . Healthcare maintenance   . Rheumatic heart disease   . Palpitations   . Sinus bradycardia     ROS:   All systems reviewed and negative except as noted in the HPI.   Past Surgical History  Procedure Laterality Date  . Melonoma removal      one level 2 melanoma with a single level 2 melanoma excised from his low back in aMay of 2003  . Colonoscopy      8/05 and 4/11  . Tonsillectomy    . Hernia repair Right     inguinal - as an infant   . Shoulder arthroscopy with subacromial  decompression and bicep tendon repair Right 01/28/2015    Procedure: RIGHT SHOULDER ARTHROSCOPY BICEP TENODESIS LABRAL DEBRIDEMENT SUBACROMIAL DECOMPRESSION  DISTAL CLAVICLE RESECTION ;  Surgeon: Justice Britain, MD;  Location: [facility];  Service: Orthopedics;  Laterality: Right;     Family History  Problem Relation Age of Onset  . Depression Brother   . Hypertension Brother   . Colon polyps Mother   . COPD Mother   . Autism Brother   . Hypertension Brother   . Other Father      History   Social History  . Marital Status: Married    Spouse Name: N/A  . Number of Children: N/A  . Years of Education: N/A   Occupational History  . Not on file.   Social History Main Topics  . Smoking status: Former Smoker -- 1 years  . Smokeless tobacco: Never Used     Comment: teenager only   . Alcohol Use: Yes     Comment: occ  . Drug Use: No  . Sexual Activity: Not on file   Other Topics Concern  . Not on file   Social History Narrative   Attorney.      BP 142/66 mmHg  Pulse 56  Ht 5\' 11"  (1.803 m)  Wt  193 lb (87.544 kg)  BMI 26.93 kg/m2  Physical Exam:  Well appearing middle-aged man, NAD HEENT: Unremarkable Neck:  No JVD, no thyromegally Lungs:  Clear with no wheezes, rales, or rhonchi. HEART:  Regular rate rhythm, grade 2/6 systolic murmur and grade 2/6 diastolic blowing murmur, no rubs, no clicks Abd:  soft, positive bowel sounds, no organomegally, no rebound, no guarding Ext:  2 plus pulses, no edema, no cyanosis, no clubbing Skin:  No rashes no nodules Neuro:  CN II through XII intact, motor grossly intact  EKG - sinus bradycardia  Assess/Plan:

## 2015-04-08 NOTE — Assessment & Plan Note (Signed)
His blood pressure is well controlled at home. He will continue a low sodium diet and his current meds. I have asked him to call if his blood pressure increases.

## 2015-04-08 NOTE — Patient Instructions (Signed)
Medication Instructions:  Your physician recommends that you continue on your current medications as directed. Please refer to the Current Medication list given to you today.  Labwork: None ordered  Testing/Procedures: Your physician has requested that you have an echocardiogram in a year (prior to seeing Dr. Lovena Le). Echocardiography is a painless test that uses sound waves to create images of your heart. It provides your doctor with information about the size and shape of your heart and how well your heart's chambers and valves are working. This procedure takes approximately one hour. There are no restrictions for this procedure.  Follow-Up: Your physician wants you to follow-up in: 1 year with Dr. Lovena Le.  You will receive a reminder letter in the mail two months in advance. If you don't receive a letter, please call our office to schedule the follow-up appointment.   Thank you for choosing [facility]!!

## 2015-04-12 ENCOUNTER — Encounter: Payer: Self-pay | Admitting: Internal Medicine

## 2015-04-12 ENCOUNTER — Ambulatory Visit (INDEPENDENT_AMBULATORY_CARE_PROVIDER_SITE_OTHER): Payer: BLUE CROSS/BLUE SHIELD | Admitting: Internal Medicine

## 2015-04-12 ENCOUNTER — Other Ambulatory Visit (INDEPENDENT_AMBULATORY_CARE_PROVIDER_SITE_OTHER): Payer: BLUE CROSS/BLUE SHIELD

## 2015-04-12 VITALS — BP 138/62 | HR 55 | Ht 71.0 in | Wt 191.8 lb

## 2015-04-12 DIAGNOSIS — Z0000 Encounter for general adult medical examination without abnormal findings: Secondary | ICD-10-CM

## 2015-04-12 DIAGNOSIS — Z23 Encounter for immunization: Secondary | ICD-10-CM

## 2015-04-12 DIAGNOSIS — I10 Essential (primary) hypertension: Secondary | ICD-10-CM

## 2015-04-12 DIAGNOSIS — E7800 Pure hypercholesterolemia, unspecified: Secondary | ICD-10-CM

## 2015-04-12 DIAGNOSIS — I359 Nonrheumatic aortic valve disorder, unspecified: Secondary | ICD-10-CM | POA: Diagnosis not present

## 2015-04-12 DIAGNOSIS — B029 Zoster without complications: Secondary | ICD-10-CM

## 2015-04-12 LAB — CBC WITH DIFFERENTIAL/PLATELET
BASOS PCT: 0.5 % (ref 0.0–3.0)
Basophils Absolute: 0 10*3/uL (ref 0.0–0.1)
EOS ABS: 0.2 10*3/uL (ref 0.0–0.7)
EOS PCT: 3.1 % (ref 0.0–5.0)
HCT: 44.6 % (ref 39.0–52.0)
HEMOGLOBIN: 15.4 g/dL (ref 13.0–17.0)
LYMPHS PCT: 23.9 % (ref 12.0–46.0)
Lymphs Abs: 1.4 10*3/uL (ref 0.7–4.0)
MCHC: 34.6 g/dL (ref 30.0–36.0)
MCV: 89.3 fl (ref 78.0–100.0)
Monocytes Absolute: 0.5 10*3/uL (ref 0.1–1.0)
Monocytes Relative: 8.3 % (ref 3.0–12.0)
NEUTROS ABS: 3.9 10*3/uL (ref 1.4–7.7)
Neutrophils Relative %: 64.2 % (ref 43.0–77.0)
Platelets: 180 10*3/uL (ref 150.0–400.0)
RBC: 5 Mil/uL (ref 4.22–5.81)
RDW: 13.5 % (ref 11.5–15.5)
WBC: 6 10*3/uL (ref 4.0–10.5)

## 2015-04-12 LAB — TSH: TSH: 1.91 u[IU]/mL (ref 0.35–4.50)

## 2015-04-12 LAB — URINALYSIS
BILIRUBIN URINE: NEGATIVE
Hgb urine dipstick: NEGATIVE
Ketones, ur: NEGATIVE
LEUKOCYTES UA: NEGATIVE
Nitrite: NEGATIVE
PH: 6 (ref 5.0–8.0)
SPECIFIC GRAVITY, URINE: 1.025 (ref 1.000–1.030)
Total Protein, Urine: NEGATIVE
URINE GLUCOSE: NEGATIVE
Urobilinogen, UA: 0.2 (ref 0.0–1.0)

## 2015-04-12 LAB — TESTOSTERONE: Testosterone: 313.62 ng/dL (ref 300.00–890.00)

## 2015-04-12 LAB — LIPID PANEL
CHOLESTEROL: 206 mg/dL — AB (ref 0–200)
HDL: 47.2 mg/dL (ref >39.00)
LDL CALC: 144 mg/dL — AB (ref 0–99)
NonHDL: 158.8
Total CHOL/HDL Ratio: 4
Triglycerides: 76 mg/dL (ref 0.0–149.0)
VLDL: 15.2 mg/dL (ref 0.0–40.0)

## 2015-04-12 LAB — PSA: PSA: 3.04 ng/mL (ref 0.10–4.00)

## 2015-04-12 MED ORDER — TADALAFIL 20 MG PO TABS: 20.0000 mg | ORAL_TABLET | Freq: Every day | ORAL | Status: AC | PRN

## 2015-04-12 NOTE — Progress Notes (Addendum)
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 Annual Exam    Pt is fasting. He states doing well and denies any co's today.    actue rash x 48 hours R neck more itching than burning denies unusual exp  No   chronic cough, limiting sob or cp or chest tightness, subjective wheeze overt sinus or hb symptoms. No unusual exp hx or h/o childhood pna/ asthma or knowledge of premature birth.  Sleeping ok without nocturnal  or early am exacerbation  of respiratory  c/o's or need for noct saba. Also denies any obvious fluctuation of symptoms with weather or environmental changes or other aggravating or alleviating factors except as outlined above   Current Medications, Allergies, Complete Past Medical History, Past Surgical History, Family History, and Social History were reviewed in [employer] record.  ROS  The following are not active complaints unless bolded sore throat, dysphagia, dental problems, itching, sneezing,  nasal congestion or excess/ purulent secretions, ear ache,   fever, chills, sweats, unintended wt loss, pleuritic or exertional cp, hemoptysis,  orthopnea pnd or leg swelling, presyncope, palpitations, heartburn, abdominal pain, anorexia, nausea, vomiting, diarrhea  or change in bowel or urinary habits, change in stools or urine, dysuria,hematuria,  rash, arthralgias, visual complaints, headache, numbness weakness or ataxia or problems with walking or coordination,  change in mood/affect or memory.                    Past Medical History:  RHINITIS, CHRONIC (ICD-472.0)  Hx of DIVERTICULITIS, COLON (ICD-562.11)............................Leon Gibson Leon Gibson  Hx of POLYP, COLON (ICD-211.3)  - Colonoscopy 05/2004, 12/2009  AORTIC INSUFFICIENCY (ICD-424.1)........................................Leon KitchenLovena Gibson  -History of rheumatic heart disease  - Echo 02/20/12 Melanoma..................................................................................Leon KitchenLomax  - L paralumbar 2003, no adjuvant rx  R shouder DJD/rotator s/p surgery April 28 Gibson ...................  Supple  Weight Gain  - ideal < 191 Target wt = 219 for BMI < 30  HEALTH MAINTENANCE................................................................Leon KitchenWert  - Td  August 07, 2008. prevnar 13 04/12/15  - CPX 7/11/Gibson    Family History:  MM in father  colon polyps mother  sudden death older brother no clear dx but mo/osa  depression brother    Social History:  Former smoker, teenager only  ETOH occ  Leon Gibson retired of Leon Gibson                Objective:   Physical Exam   Wt 201 August 07, 2008 > 204 August 11, 2009 > 208 November 05, 2009 > 203 November 18, 2009 > 198 December 09, 2010 > 04/03/2012 187 >189 01/30/13 > 185 04/09/2013> 04/09/2014   189 > 7/11/Gibson  192   Ambulatory healthy appearing wm in no acute distress  HEENT: nl dentition, turbinates, and orophanx. Nl external ear canals without cough reflex  Neck without JVD/Nodes/TM  Lungs clear to A and P bilaterally without cough on insp or exp maneuvers  RRR II-III/VI sem Abd soft and benign with nl excursion in the supine position. No bruits  or organomegaly  Ext warm without calf tenderness, cyanosis clubbing or edema    MS nl gait Neuro: intact sensorium, no deficits Skin:  Multiple asym nevi/ zoster like rask R prox C6 distribution  GU Testes down, no nodules or IH Rectal mod bph smooth no nodules, Stool G neg       CXR  04/09/2014 :  The heart size and mediastinal contours are within  normal limits. Both lungs are clear. The visualized skeletal structures are unremarkable.      Labs ordered/ reviewed:     Chemistry      Component Value Date/Time   NA 140 04/18/Gibson 0926   K 3.9 04/18/Gibson 0926   CL 104 04/18/Gibson 0926   CO2 28 04/18/Gibson 0926   BUN 14 04/18/Gibson 0926   CREATININE 0.80 04/18/Gibson 0926      Component Value Date/Time   CALCIUM 9.1 04/18/Gibson 0926   ALKPHOS 64 04/18/Gibson 0926   AST 23 04/18/Gibson 0926   ALT 19 04/18/Gibson 0926   BILITOT 0.8 04/18/Gibson 0926        Lab Results  Component Value Date   TSH 1.91 07/11/Gibson    Lab Results  Component Value Date   WBC 6.0 07/11/Gibson   HGB 15.4 07/11/Gibson   HCT 44.6 07/11/Gibson   MCV 89.3 07/11/Gibson   PLT 180.0 07/11/Gibson       Lab Results  Component Value Date   PSA 3.04 07/11/Gibson   PSA 3.09 04/09/2014   PSA 3.14 04/09/2013       Assessment:

## 2015-04-12 NOTE — Progress Notes (Signed)
Quick Note:  Spoke with pt and notified of results per Dr. Wert. Pt verbalized understanding and denied any questions.  ______ 

## 2015-04-12 NOTE — Patient Instructions (Addendum)
Please remember to go to the lab and x-ray department downstairs for your tests - we will call you with the results when they are available.    Prevnar 75 today   Ok to use cortisone cream otc for rash call if not better in one week

## 2015-04-14 ENCOUNTER — Telehealth: Payer: Self-pay | Admitting: Internal Medicine

## 2015-04-14 MED ORDER — FAMCICLOVIR 500 MG PO TABS: 500.0000 mg | ORAL_TABLET | Freq: Three times a day (TID) | ORAL | Status: DC

## 2015-04-14 NOTE — Telephone Encounter (Signed)
Best rx now is famvir and I already called it in but I need to see him in a week for recheck and ok to give him norco 5 x 40 if he needs

## 2015-04-14 NOTE — Telephone Encounter (Signed)
Spoke with pt- states that since OV rash has spread and has become very painful.  Pt states that when seen by MW 04/12/15 the rash was looked at but was not painful.  There was concern of it being possible shingles but was not 100% sure.  Pt states that rash is now very painful, red, hot to touch and itchy.  Rash has now spread down right arm to hand- starts at shoulder  Pt reports having shingles x 20+years ago and was treated with Zovirax Rx - worked great and gave relief.  Pt is requesting that something be called in.  Please advise Dr Melvyn Novas. Thanks.

## 2015-04-14 NOTE — Telephone Encounter (Signed)
Noted. Nothing further needed. 

## 2015-04-14 NOTE — Telephone Encounter (Signed)
That's fine

## 2015-04-14 NOTE — Telephone Encounter (Signed)
Pt aware that Famvir has been called into pharmacy ([facility]) Pt aware also that MW recommends a RX for Norco PRN pain- pt aware that he will have to pick this up if he wants it.  Pt states that he has some Percocet at home left over from an old shoulder surgery that is not expired yet. Pt wanting to know if this is something that he can use instead if need be. I advised that this should be fine. Will run by Dr Melvyn Novas to get okay.  No need to call pt back if Dr Charolotte Capuchin use of Percocet.

## 2015-04-18 ENCOUNTER — Encounter: Payer: Self-pay | Admitting: Internal Medicine

## 2015-04-18 NOTE — Assessment & Plan Note (Signed)
-   Target LDL < 130 due to Type A, stress, ? Pos fm hx brothers  Lab Results  Component Value Date   CHOL 206* 04/12/2015   HDL 47.20 04/12/2015   LDLCALC 144* 04/12/2015   LDLDIRECT 167.3 04/09/2013   TRIG 76.0 04/12/2015   CHOLHDL 4 04/12/2015     Not ideal but hdl high > diet and ex

## 2015-04-18 NOTE — Assessment & Plan Note (Signed)
famvir x 7 days and recheck prn

## 2015-04-18 NOTE — Assessment & Plan Note (Signed)
Dr Forde Dandy not complte 04/08/15 reviewed, agree remains asymptomatic > f/u one year planned

## 2015-04-18 NOTE — Assessment & Plan Note (Signed)
prevnar 13 given / all other vaccination utd, declined shingles at this time

## 2015-04-18 NOTE — Assessment & Plan Note (Signed)
Adequate control on present rx, reviewed > no change in rx needed   

## 2015-04-23 ENCOUNTER — Ambulatory Visit: Payer: BLUE CROSS/BLUE SHIELD | Admitting: Internal Medicine

## 2015-06-26 ENCOUNTER — Other Ambulatory Visit: Payer: Self-pay | Admitting: Internal Medicine

## 2015-08-19 ENCOUNTER — Encounter: Payer: Self-pay | Admitting: Internal Medicine

## 2015-09-08 ENCOUNTER — Encounter: Payer: Self-pay | Admitting: Internal Medicine

## 2015-10-27 ENCOUNTER — Other Ambulatory Visit: Payer: Self-pay | Admitting: Internal Medicine

## 2015-10-29 ENCOUNTER — Telehealth: Payer: Self-pay | Admitting: *Deleted

## 2015-10-29 NOTE — Telephone Encounter (Signed)
Patient called and stated that he has a dental appointment scheduled for next Tuesday and needs a new rx [facility]

## 2015-10-29 NOTE — Telephone Encounter (Signed)
Rx sent in

## 2016-03-22 ENCOUNTER — Other Ambulatory Visit: Payer: Self-pay | Admitting: Internal Medicine

## 2016-03-22 DIAGNOSIS — I350 Nonrheumatic aortic (valve) stenosis: Secondary | ICD-10-CM

## 2016-03-24 ENCOUNTER — Ambulatory Visit (HOSPITAL_COMMUNITY): Payer: BLUE CROSS/BLUE SHIELD

## 2016-03-27 ENCOUNTER — Other Ambulatory Visit: Payer: Self-pay

## 2016-03-27 ENCOUNTER — Ambulatory Visit (HOSPITAL_COMMUNITY): Payer: BLUE CROSS/BLUE SHIELD | Attending: Internal Medicine

## 2016-03-27 DIAGNOSIS — I358 Other nonrheumatic aortic valve disorders: Secondary | ICD-10-CM | POA: Diagnosis not present

## 2016-03-27 DIAGNOSIS — I10 Essential (primary) hypertension: Secondary | ICD-10-CM | POA: Diagnosis not present

## 2016-03-27 DIAGNOSIS — I350 Nonrheumatic aortic (valve) stenosis: Secondary | ICD-10-CM | POA: Insufficient documentation

## 2016-03-27 DIAGNOSIS — I359 Nonrheumatic aortic valve disorder, unspecified: Secondary | ICD-10-CM | POA: Diagnosis present

## 2016-04-14 ENCOUNTER — Other Ambulatory Visit: Payer: Self-pay | Admitting: Internal Medicine

## 2016-04-17 ENCOUNTER — Encounter: Payer: Self-pay | Admitting: Internal Medicine

## 2016-04-17 ENCOUNTER — Ambulatory Visit (INDEPENDENT_AMBULATORY_CARE_PROVIDER_SITE_OTHER): Payer: BLUE CROSS/BLUE SHIELD | Admitting: Internal Medicine

## 2016-04-17 VITALS — BP 132/70 | HR 73 | Ht 71.0 in | Wt 188.8 lb

## 2016-04-17 DIAGNOSIS — I350 Nonrheumatic aortic (valve) stenosis: Secondary | ICD-10-CM | POA: Diagnosis not present

## 2016-04-17 LAB — ECHOCARDIOGRAM COMPLETE
AOVTI: 57.3 cm
AV Mean grad: 15 mmHg
AV Peak grad: 28 mmHg
AV VEL mean LVOT/AV: 0.5
AV area mean vel ind: 0.92 cm2/m2
AV pk vel: 266 cm/s
AV vel: 2
AVA: 2 cm2
AVAREAMEANV: 1.9 cm2
AVAREAVTI: 1.76 cm2
AVAREAVTIIND: 0.97 cm2/m2
AVLVOTPG: 6 mmHg
AVPHT: 461 ms
Ao pk vel: 0.46 m/s
CHL CUP AV PEAK INDEX: 0.85
DOP CAL AO MEAN VELOCITY: 175 cm/s
E decel time: 465 msec
EERAT: 9.92
FS: 34 % (ref 28–44)
IVS/LV PW RATIO, ED: 1.08
LA ID, A-P, ES: 36 mm
LA diam index: 1.74 cm/m2
LA vol A4C: 35 ml
LA vol index: 15.9 mL/m2
LA vol: 33 mL
LEFT ATRIUM END SYS DIAM: 36 mm
LV E/e'average: 9.92
LV PW d: 13.1 mm — AB (ref 0.6–1.1)
LV TDI E'MEDIAL: 7.35
LV e' LATERAL: 6.47 cm/s
LVEEMED: 9.92
LVOT SV: 115 mL
LVOT VTI: 30.2 cm
LVOT area: 3.8 cm2
LVOT diameter: 22 mm
LVOT peak VTI: 0.53 cm
LVOTPV: 123 cm/s
MV Dec: 465
MV pk A vel: 87.4 m/s
MV pk E vel: 64.2 m/s
Reg peak vel: 242 cm/s
TDI e' lateral: 6.47
TR max vel: 242 cm/s
Valve area index: 0.97

## 2016-04-17 NOTE — Progress Notes (Signed)
HPI Leon Gibson returns today for followup. He is a 65 year old man with a history of moderate aortic insufficiency and moderate aortic stenosis and sinus bradycardia, along with a history of hypertension which has been well-controlled. The patient has done well in the interim.  A recent 2-D echo demonstrates moderate aortic insufficiency and stenosis and no LV dysfunction. His chamber sizes were not appreciably enlarged. His activity is good he denies chest pain or shortness of breath. No syncope. [place]. No Known Allergies   Current Outpatient Prescriptions  Medication Sig Dispense Refill  . amoxicillin (AMOXIL) 500 MG capsule TAKE 4 CAPSULES BY MOUTH 1 HOUR PRIOR TO DENTAL PROCEDURE    . aspirin 81 MG tablet Take 81 mg by mouth daily.      . famciclovir (FAMVIR) 500 MG tablet Take 1 tablet (500 mg total) by mouth 3 (three) times daily. 21 tablet 0  . fluticasone (FLONASE) 50 MCG/ACT nasal spray SPRAY 1 SPRAY INTO THE NOSE TWICE A DAY 16 g 11  . Multiple Vitamin (MULTIVITAMIN) capsule Take 1 capsule by mouth daily.      . ramipril (ALTACE) 5 MG capsule TAKE 1 CAPSULE (5 MG TOTAL) BY MOUTH DAILY. 90 capsule 0  . tadalafil (CIALIS) 20 MG tablet Take 1 tablet (20 mg total) by mouth daily as needed for erectile dysfunction. 10 tablet 11   No current facility-administered medications for this visit.     Past Medical History  Diagnosis Date  . Chronic rhinitis   . Diverticulitis of colon (without mention of hemorrhage)   . Aortic valve disorders   . Colon polyp   . Melanoma ([place])     L paralumbar 2003, no djuvant rx  . Weight gain   . Healthcare maintenance   . Rheumatic heart disease   . Palpitations   . Sinus bradycardia     ROS:   All systems reviewed and negative except as noted in the HPI.   Past Surgical History  Procedure Laterality Date  . Melonoma removal      one level 2 melanoma with a single level 2 melanoma excised from his low back  in aMay of 2003  . Colonoscopy      8/05 and 4/11  . Tonsillectomy    . Hernia repair Right     inguinal - as an infant   . Shoulder arthroscopy with subacromial decompression and bicep tendon repair Right 01/28/2015    Procedure: RIGHT SHOULDER ARTHROSCOPY BICEP TENODESIS LABRAL DEBRIDEMENT SUBACROMIAL DECOMPRESSION  DISTAL CLAVICLE RESECTION ;  Surgeon: Justice Britain, MD;  Location: [facility];  Service: Orthopedics;  Laterality: Right;     Family History  Problem Relation Age of Onset  . Depression Brother   . Hypertension Brother   . Colon polyps Mother   . COPD Mother   . Autism Brother   . Hypertension Brother   . Other Father      Social History   Social History  . Marital Status: Married    Spouse Name: N/A  . Number of Children: N/A  . Years of Education: N/A   Occupational History  . Not on file.   Social History Main Topics  . Smoking status: Former Smoker -- 1 years  . Smokeless tobacco: Never Used     Comment: teenager only   . Alcohol Use: Yes     Comment: occ  . Drug Use: No  . Sexual Activity: Not on file   Other  Topics Concern  . Not on file   Social History Narrative   Attorney.      BP 132/70 mmHg  Pulse 73  Ht 5\' 11"  (1.803 m)  Wt 188 lb 12.8 oz (85.639 kg)  BMI 26.34 kg/m2  Physical Exam:  Well appearing middle-aged man, NAD HEENT: Unremarkable Neck:  6 cm JVD, no thyromegally Lungs:  Clear with no wheezes, rales, or rhonchi. HEART:  Regular rate rhythm, grade 2/6 systolic murmur and grade 2/6 diastolic blowing murmur, no rubs, no clicks Abd:  soft, positive bowel sounds, no organomegally, no rebound, no guarding Ext:  2 plus pulses, no edema, no cyanosis, no clubbing Skin:  No rashes no nodules Neuro:  CN II through XII intact, motor grossly intact  EKG - nsr  Assess/Plan: 1. AS/AI - he is asymptomatic and his valve has not changed appreciably. Will see him back in a year 2. HTN - his blood pressure has been well controlled. He  will maintain a low sodium diet. 3. Melanoma - he is s/p resection with no evidence of recurrence  Gregg Taylor,M.D.

## 2016-04-17 NOTE — Patient Instructions (Addendum)
Medication Instructions:  Your physician recommends that you continue on your current medications as directed. Please refer to the Current Medication list given to you today.  Labwork: None ordered  Testing/Procedures: Your physician has requested that you have an echocardiogram (In June 2018, before coming into to see Dr. Lovena Le in July 2011).  Echocardiography is a painless test that uses sound waves to create images of your heart. It provides your doctor with information about the size and shape of your heart and how well your heart's chambers and valves are working. This procedure takes approximately one hour. There are no restrictions for this procedure.  Follow-Up: Your physician wants you to follow-up in: 1 year with Dr. Lovena Le.  You will receive a reminder letter in the mail two months in advance. If you don't receive a letter, please call our office to schedule the follow-up appointment.  If you need a refill on your cardiac medications before your next appointment, please call your pharmacy.   Thank you for choosing [facility]!!

## 2016-04-21 ENCOUNTER — Other Ambulatory Visit (INDEPENDENT_AMBULATORY_CARE_PROVIDER_SITE_OTHER): Payer: BLUE CROSS/BLUE SHIELD

## 2016-04-21 ENCOUNTER — Encounter: Payer: Self-pay | Admitting: Internal Medicine

## 2016-04-21 ENCOUNTER — Ambulatory Visit (INDEPENDENT_AMBULATORY_CARE_PROVIDER_SITE_OTHER): Payer: BLUE CROSS/BLUE SHIELD | Admitting: Internal Medicine

## 2016-04-21 VITALS — BP 124/68 | HR 59 | Ht 71.0 in | Wt 189.0 lb

## 2016-04-21 DIAGNOSIS — E7800 Pure hypercholesterolemia, unspecified: Secondary | ICD-10-CM | POA: Diagnosis not present

## 2016-04-21 DIAGNOSIS — I10 Essential (primary) hypertension: Secondary | ICD-10-CM | POA: Diagnosis not present

## 2016-04-21 DIAGNOSIS — Z0000 Encounter for general adult medical examination without abnormal findings: Secondary | ICD-10-CM

## 2016-04-21 DIAGNOSIS — I359 Nonrheumatic aortic valve disorder, unspecified: Secondary | ICD-10-CM

## 2016-04-21 LAB — CBC WITH DIFFERENTIAL/PLATELET
Basophils Absolute: 0 10*3/uL (ref 0.0–0.1)
Basophils Relative: 0.4 % (ref 0.0–3.0)
EOS PCT: 1.7 % (ref 0.0–5.0)
Eosinophils Absolute: 0.1 10*3/uL (ref 0.0–0.7)
HCT: 45.2 % (ref 39.0–52.0)
HEMOGLOBIN: 15.6 g/dL (ref 13.0–17.0)
LYMPHS ABS: 1.4 10*3/uL (ref 0.7–4.0)
Lymphocytes Relative: 19.1 % (ref 12.0–46.0)
MCHC: 34.4 g/dL (ref 30.0–36.0)
MCV: 88.6 fl (ref 78.0–100.0)
MONO ABS: 0.5 10*3/uL (ref 0.1–1.0)
Monocytes Relative: 6.9 % (ref 3.0–12.0)
Neutro Abs: 5.2 10*3/uL (ref 1.4–7.7)
Neutrophils Relative %: 71.9 % (ref 43.0–77.0)
Platelets: 188 10*3/uL (ref 150.0–400.0)
RBC: 5.11 Mil/uL (ref 4.22–5.81)
RDW: 13.4 % (ref 11.5–15.5)
WBC: 7.3 10*3/uL (ref 4.0–10.5)

## 2016-04-21 LAB — BASIC METABOLIC PANEL
BUN: 16 mg/dL (ref 6–23)
CHLORIDE: 103 meq/L (ref 96–112)
CO2: 29 mEq/L (ref 19–32)
Calcium: 9.4 mg/dL (ref 8.4–10.5)
Creatinine, Ser: 0.86 mg/dL (ref 0.40–1.50)
GFR: 95.32 mL/min (ref >60.00)
GLUCOSE: 90 mg/dL (ref 70–99)
POTASSIUM: 4.6 meq/L (ref 3.5–5.1)
Sodium: 138 mEq/L (ref 135–145)

## 2016-04-21 LAB — HEPATIC FUNCTION PANEL
ALT: 17 U/L (ref 0–53)
AST: 20 U/L (ref 0–37)
Albumin: 4.4 g/dL (ref 3.5–5.2)
Alkaline Phosphatase: 64 U/L (ref 39–117)
BILIRUBIN TOTAL: 0.9 mg/dL (ref 0.2–1.2)
Bilirubin, Direct: 0.2 mg/dL (ref 0.0–0.3)
Total Protein: 6.8 g/dL (ref 6.0–8.3)

## 2016-04-21 LAB — LIPID PANEL
CHOLESTEROL: 220 mg/dL — AB (ref 0–200)
HDL: 52.3 mg/dL (ref >39.00)
LDL CALC: 151 mg/dL — AB (ref 0–99)
NonHDL: 168.15
TRIGLYCERIDES: 88 mg/dL (ref 0.0–149.0)
Total CHOL/HDL Ratio: 4
VLDL: 17.6 mg/dL (ref 0.0–40.0)

## 2016-04-21 LAB — PSA: PSA: 2.61 ng/mL (ref 0.10–4.00)

## 2016-04-21 LAB — TSH: TSH: 1.1 u[IU]/mL (ref 0.35–4.50)

## 2016-04-21 NOTE — Patient Instructions (Addendum)
Please remember to go to the lab department downstairs for your tests - we will call you with the results when they are available.

## 2016-04-21 NOTE — Progress Notes (Signed)
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   HYPERCHOLESTEROLEMIA (ICD-272.0)  Target < 130 LDL   Problem # 2: AORTIC INSUFFICIENCY (ICD-424.1)   Problem # 3: RHINITIS, CHRONIC (ICD-472.0    04/12/2015 f/u ov/Leon Gibson re: cpx  Chief Complaint  Patient presents with  . Annual Exam    Pt is fasting. He states doing well and denies any co's today.    acute  rash x 48 hours R neck more itching than burning denies unusual exp >> turned out to be contact dermatitis    04/21/2016  f/u ov/Leon Gibson re: cpx  Chief Complaint  Patient presents with  . Annual Exam    Pt is fasting. He is doing well and denies any co's today.   tennis 3-4 x per week no cp/ tia/ claudication/    No sob/  chronic cough, limiting sob or cp or chest tightness, subjective wheeze overt sinus or hb symptoms. No unusual exp hx or h/o childhood pna/ asthma or knowledge of premature birth.  Sleeping ok without nocturnal  or early am exacerbation  of respiratory  c/o's or need for noct saba. Also denies any obvious fluctuation of symptoms with weather or environmental changes or other aggravating or alleviating factors except as outlined above   Current Medications, Allergies, Complete Past Medical History, Past Surgical History, Family History, and Social History were reviewed in [employer] record.  ROS  The following are not active complaints unless bolded sore throat, dysphagia, dental problems, itching, sneezing,  nasal congestion or excess/ purulent secretions, ear ache,   fever, chills, sweats, unintended wt loss, pleuritic or exertional cp, hemoptysis,  orthopnea pnd or leg swelling, presyncope, palpitations, heartburn, abdominal pain, anorexia, nausea, vomiting, diarrhea  or  change in bowel or urinary habits, change in stools or urine, dysuria,hematuria,  rash, arthralgias, visual complaints, headache, numbness weakness or ataxia or problems with walking or coordination,  change in mood/affect or memory.         Past Medical History:  RHINITIS, CHRONIC (ICD-472.0)  Hx of DIVERTICULITIS, COLON (ICD-562.11)............................Leon Gibson Kitchen Leon Gibson  Hx of POLYP, COLON (ICD-211.3)  - Colonoscopy 05/2004, 12/2009  AORTIC INSUFFICIENCY (ICD-424.1)........................................Leon Gibson KitchenLovena Gibson  -History of rheumatic heart disease  - Echo 02/20/12 Melanoma..................................................................................Leon Gibson Kitchen  Leon Gibson  - L paralumbar 2003, no adjuvant rx  R shouder DJD/rotator s/p surgery January 28 2015 ...................  Supple  Weight Gain  - ideal < 191 Target wt = 219 for BMI < 30  HEALTH MAINTENANCE............................................................... 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 Nl external ear canals without cough reflex  Neck without JVD/Nodes/TM  Lungs clear to A and P bilaterally without cough on insp or exp maneuvers  RRR II-III/VI sem Abd soft and benign with nl excursion in the supine position. No bruits or organomegaly  Ext warm without calf tenderness, cyanosis clubbing or edema    MS nl  gait Neuro: intact sensorium, no deficits Skin:  Multiple asym nevi  GU Testes down, no nodules or IH Rectal mod bph smooth no nodules, Stool G neg    Labs ordered/ reviewed:    Chemistry      Component Value Date/Time   NA 138 04/21/2016 0908   K 4.6 04/21/2016 0908   CL 103 04/21/2016 0908   CO2 29 04/21/2016 0908   BUN 16 04/21/2016 0908   CREATININE 0.86 04/21/2016 0908      Component Value Date/Time   CALCIUM 9.4 04/21/2016 0908   ALKPHOS 64 04/21/2016 0908   AST 20 04/21/2016 0908   ALT 17 04/21/2016 0908   BILITOT 0.9 04/21/2016 0908        Lab Results  Component Value Date   WBC 7.3 04/21/2016   HGB 15.6 04/21/2016   HCT 45.2 04/21/2016   MCV 88.6 04/21/2016   PLT 188.0 04/21/2016         Lab Results  Component Value Date   TSH 1.10 04/21/2016                       Assessment:

## 2016-04-23 NOTE — Assessment & Plan Note (Signed)
-   Target LDL < 130 due to Type A, stress, ? Pos fm hx brothers  LDL a bit high,  But also has high hdl  > no def indication for statin at this point > encouraged to keep up the good work on ex which is driving up the hdl and diet only to reduce ldl

## 2016-04-23 NOTE — Assessment & Plan Note (Signed)
Adequate control on present rx, reviewed > no change in rx needed  > reviewed cough and nonspecific airway complaints with pt to be on the lookout for in future but so far so good

## 2016-04-23 NOTE — Assessment & Plan Note (Signed)
F/u with Dr Lovena Le reviewed, probably will eventually need AVR

## 2016-04-23 NOTE — Assessment & Plan Note (Signed)
Up to date

## 2016-04-24 NOTE — Progress Notes (Signed)
Spoke with pt and notified of results per Dr. Wert. Pt verbalized understanding and denied any questions. 

## 2016-07-15 ENCOUNTER — Other Ambulatory Visit: Payer: Self-pay | Admitting: Internal Medicine

## 2016-09-12 ENCOUNTER — Encounter: Payer: Self-pay | Admitting: Adult Health

## 2016-09-12 ENCOUNTER — Ambulatory Visit (INDEPENDENT_AMBULATORY_CARE_PROVIDER_SITE_OTHER): Payer: BLUE CROSS/BLUE SHIELD | Admitting: Adult Health

## 2016-09-12 DIAGNOSIS — J3089 Other allergic rhinitis: Secondary | ICD-10-CM

## 2016-09-12 DIAGNOSIS — I10 Essential (primary) hypertension: Secondary | ICD-10-CM

## 2016-09-12 DIAGNOSIS — J208 Acute bronchitis due to other specified organisms: Secondary | ICD-10-CM | POA: Diagnosis not present

## 2016-09-12 DIAGNOSIS — J209 Acute bronchitis, unspecified: Secondary | ICD-10-CM | POA: Insufficient documentation

## 2016-09-12 MED ORDER — AZITHROMYCIN 250 MG PO TABS: ORAL_TABLET | ORAL | 0 refills | Status: AC

## 2016-09-12 NOTE — Progress Notes (Signed)
Chart and office note reviewed in detail  > agree with a/p as outlined    

## 2016-09-12 NOTE — Progress Notes (Signed)
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   HYPERCHOLESTEROLEMIA (ICD-272.0)  Target < 130 LDL   Problem # 2: AORTIC INSUFFICIENCY (ICD-424.1)   Problem # 3: RHINITIS, CHRONIC (ICD-472.0    04/12/2015 f/u ov/Wert re: cpx  Chief Complaint  Patient presents with  . Annual Exam    Pt is fasting. He states doing well and denies any co's today.    acute  rash x 48 hours R neck more itching than burning denies unusual exp >> turned out to be contact dermatitis    04/21/2016  f/u ov/Wert re: cpx  Chief Complaint  Patient presents with  . Annual Exam    Pt is fasting. He is doing well and denies any co's today.   tennis 3-4 x per week no cp/ tia/ claudication/  >>no changes    09/12/2016 Acute OV : Cough /Nasal congestion  Pt presents for an acute office visit. He complains of 3 weeks of hoarseness, cough, congestion , severe coughing fits, drainage, nasal congestion , thick yellow mucus. There has been a progression of symptoms, worse at night .  He says he took sudafed and afrin without much help. Nyquil cold helps cough/drainge at night .  Marland Kitchen Says he is still able to play tennis. Appetite is good.  Wife has similar symptoms and is on abx.  He is essentially a nonsmoker  . Last cxr reviewed in 2015 showed clear lungs .   He has HTN and is controlled on Altace . Denies any chronic cough .   He has Chronic Rhinitis on Flonase . But not taking correctly .  More nasal drainage for last 3 weeks.   No recent travel , abx. Use . No fever or body aches.    Current Medications, Allergies, Complete Past Medical History, Past Surgical History, Family History, and Social History were reviewed in [employer] record.  ROS  The following are  not active complaints unless bolded Constitutional:   No  weight loss, night sweats,  Fevers, chills, fatigue, or  lassitude.  HEENT:   No headaches,  Difficulty swallowing,  Tooth/dental problems, or  Sore throat,                No sneezing, itching, ear ache, nasal congestion, post nasal drip,   CV:  No chest pain,  Orthopnea, PND, swelling in lower extremities, anasarca, dizziness, palpitations, syncope.   GI  No heartburn, indigestion, abdominal pain, nausea, vomiting, diarrhea, change in bowel habits, loss of appetite, bloody stools.   Resp: No shortness of breath with exertion or at rest.  No excess mucus, no productive cough,  No non-productive cough,  No coughing up of blood.  No change in color of mucus.  No wheezing.  No chest wall deformity  Skin: no rash or lesions.  GU: no dysuria, change in color of urine, no urgency or frequency.  No flank pain, no hematuria   MS:  No joint pain or swelling.  No decreased range of motion.  No back pain.  Psych:  No change in mood or affect. No depression or anxiety.  No memory loss.              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  Supple  Weight Gain  - ideal < 191 Target wt = 219 for BMI < 30  HEALTH MAINTENANCE............................................................... 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   NECK:  Supple w/ fair ROM; no JVD; normal carotid impulses w/o bruits; no thyromegaly or nodules palpated; no lymphadenopathy.    RESP  Clear  P & A; w/o, wheezes/ rales/ or rhonchi. no accessory muscle use, no dullness to percussion  CARD:  RRR, no m/r/g  , no peripheral edema, pulses intact, no cyanosis or clubbing.  GI:   Soft & nt; nml bowel sounds; no organomegaly or masses detected.   Musco: Warm bil, no deformities or joint swelling noted.   Neuro: alert, no focal deficits noted.    Skin: Warm, no lesions or rashes     Labs ordered/ reviewed:  CBC Latest Ref Rng & Units 04/21/2016 04/12/2015 01/18/2015  WBC 4.0 - 10.5 K/uL 7.3 6.0 5.5  Hemoglobin 13.0 - 17.0 g/dL 15.6 15.4 14.8  Hematocrit 39.0 - 52.0 % 45.2 44.6 44.2  Platelets 150.0 - 400.0 K/uL 188.0 180.0 140(L)    BMP Latest Ref Rng & Units 04/21/2016 01/18/2015 04/09/2014  Glucose 70 - 99 mg/dL 90 93 101(H)  BUN 6 - 23 mg/dL 16 14 16   Creatinine 0.40 - 1.50 mg/dL 0.86 0.80 0.9  Sodium 135 - 145 mEq/L 138 140 138  Potassium 3.5 - 5.1 mEq/L 4.6 3.9 4.4  Chloride 96 - 112 mEq/L 103 104 103  CO2 19 - 32 mEq/L 29 28 29   Calcium 8.4 - 10.5 mg/dL 9.4 9.1 9.2               Aneesa Romey NP-C   [facility]  09/12/2016

## 2016-09-12 NOTE — Assessment & Plan Note (Signed)
Flare with URI and off flonase   Plan  Patient Instructions  Zpack take as directed.  Mucinex DM Twice daily  As needed  Cough/congestion  Saline nasal rinses As needed   Restart Flonase 2 puffs daily As needed  Nasal congestion  May use Claritin 10mg  At bedtime  As needed  Drainage  If cough does not resolve will need office visit. To have additional testing /evaluation.  Please contact office for sooner follow up if symptoms do not improve or worsen or seek emergency care

## 2016-09-12 NOTE — Patient Instructions (Signed)
Zpack take as directed.  Mucinex DM Twice daily  As needed  Cough/congestion  Saline nasal rinses As needed   Restart Flonase 2 puffs daily As needed  Nasal congestion  May use Claritin 10mg  At bedtime  As needed  Drainage  If cough does not resolve will need office visit. To have additional testing /evaluation.  Please contact office for sooner follow up if symptoms do not improve or worsen or seek emergency care

## 2016-09-12 NOTE — Assessment & Plan Note (Signed)
Controlled but advised on sudafed use  Watch cough as ACE could be aggravating.

## 2016-09-12 NOTE — Assessment & Plan Note (Signed)
Exacerbation   Plan  Patient Instructions  Zpack take as directed.  Mucinex DM Twice daily  As needed  Cough/congestion  Saline nasal rinses As needed   Restart Flonase 2 puffs daily As needed  Nasal congestion  May use Claritin 10mg  At bedtime  As needed  Drainage  If cough does not resolve will need office visit. To have additional testing /evaluation.  Please contact office for sooner follow up if symptoms do not improve or worsen or seek emergency care

## 2017-04-12 ENCOUNTER — Encounter: Payer: Self-pay | Admitting: Internal Medicine

## 2017-04-12 ENCOUNTER — Ambulatory Visit (INDEPENDENT_AMBULATORY_CARE_PROVIDER_SITE_OTHER): Payer: BLUE CROSS/BLUE SHIELD | Admitting: Internal Medicine

## 2017-04-12 ENCOUNTER — Other Ambulatory Visit (INDEPENDENT_AMBULATORY_CARE_PROVIDER_SITE_OTHER): Payer: BLUE CROSS/BLUE SHIELD

## 2017-04-12 ENCOUNTER — Ambulatory Visit (INDEPENDENT_AMBULATORY_CARE_PROVIDER_SITE_OTHER)
Admission: RE | Admit: 2017-04-12 | Discharge: 2017-04-12 | Disposition: A | Discharge status: Home / Self Care | Payer: BLUE CROSS/BLUE SHIELD | Source: Ambulatory Visit | Attending: Internal Medicine | Admitting: Internal Medicine

## 2017-04-12 VITALS — BP 128/64 | HR 60 | Ht 72.0 in | Wt 186.0 lb

## 2017-04-12 DIAGNOSIS — I10 Essential (primary) hypertension: Secondary | ICD-10-CM

## 2017-04-12 DIAGNOSIS — E7800 Pure hypercholesterolemia, unspecified: Secondary | ICD-10-CM | POA: Diagnosis not present

## 2017-04-12 DIAGNOSIS — R972 Elevated prostate specific antigen [PSA]: Secondary | ICD-10-CM | POA: Diagnosis not present

## 2017-04-12 DIAGNOSIS — Z23 Encounter for immunization: Secondary | ICD-10-CM | POA: Diagnosis not present

## 2017-04-12 DIAGNOSIS — Z0000 Encounter for general adult medical examination without abnormal findings: Secondary | ICD-10-CM

## 2017-04-12 LAB — LIPID PANEL
CHOL/HDL RATIO: 4
Cholesterol: 203 mg/dL — ABNORMAL HIGH (ref 0–200)
HDL: 54.9 mg/dL (ref >39.00)
LDL Cholesterol: 128 mg/dL — ABNORMAL HIGH (ref 0–99)
NONHDL: 147.64
Triglycerides: 98 mg/dL (ref 0.0–149.0)
VLDL: 19.6 mg/dL (ref 0.0–40.0)

## 2017-04-12 LAB — URINALYSIS
BILIRUBIN URINE: NEGATIVE
HGB URINE DIPSTICK: NEGATIVE
Ketones, ur: NEGATIVE
Leukocytes, UA: NEGATIVE
Nitrite: NEGATIVE
Specific Gravity, Urine: 1.025 (ref 1.000–1.030)
TOTAL PROTEIN, URINE-UPE24: NEGATIVE
URINE GLUCOSE: NEGATIVE
Urobilinogen, UA: 0.2 (ref 0.0–1.0)
pH: 6 (ref 5.0–8.0)

## 2017-04-12 LAB — HEPATIC FUNCTION PANEL
ALBUMIN: 4.5 g/dL (ref 3.5–5.2)
ALT: 16 U/L (ref 0–53)
AST: 21 U/L (ref 0–37)
Alkaline Phosphatase: 60 U/L (ref 39–117)
Bilirubin, Direct: 0.2 mg/dL (ref 0.0–0.3)
Total Bilirubin: 1.1 mg/dL (ref 0.2–1.2)
Total Protein: 6.9 g/dL (ref 6.0–8.3)

## 2017-04-12 LAB — BASIC METABOLIC PANEL
BUN: 17 mg/dL (ref 6–23)
CHLORIDE: 103 meq/L (ref 96–112)
CO2: 30 meq/L (ref 19–32)
CREATININE: 0.88 mg/dL (ref 0.40–1.50)
Calcium: 9.5 mg/dL (ref 8.4–10.5)
GFR: 92.54 mL/min (ref >60.00)
GLUCOSE: 101 mg/dL — AB (ref 70–99)
Potassium: 4.5 mEq/L (ref 3.5–5.1)
Sodium: 139 mEq/L (ref 135–145)

## 2017-04-12 LAB — CBC WITH DIFFERENTIAL/PLATELET
Basophils Absolute: 0.1 10*3/uL (ref 0.0–0.1)
Basophils Relative: 1 % (ref 0.0–3.0)
EOS PCT: 1.2 % (ref 0.0–5.0)
Eosinophils Absolute: 0.1 10*3/uL (ref 0.0–0.7)
HCT: 47.1 % (ref 39.0–52.0)
HEMOGLOBIN: 16.2 g/dL (ref 13.0–17.0)
Lymphocytes Relative: 19.1 % (ref 12.0–46.0)
Lymphs Abs: 1.4 10*3/uL (ref 0.7–4.0)
MCHC: 34.5 g/dL (ref 30.0–36.0)
MCV: 89.6 fl (ref 78.0–100.0)
MONOS PCT: 8.1 % (ref 3.0–12.0)
Monocytes Absolute: 0.6 10*3/uL (ref 0.1–1.0)
Neutro Abs: 5.1 10*3/uL (ref 1.4–7.7)
Neutrophils Relative %: 70.6 % (ref 43.0–77.0)
Platelets: 193 10*3/uL (ref 150.0–400.0)
RBC: 5.25 Mil/uL (ref 4.22–5.81)
RDW: 13 % (ref 11.5–15.5)
WBC: 7.2 10*3/uL (ref 4.0–10.5)

## 2017-04-12 LAB — PSA: PSA: 5.34 ng/mL — ABNORMAL HIGH (ref 0.10–4.00)

## 2017-04-12 LAB — TSH: TSH: 1.49 u[IU]/mL (ref 0.35–4.50)

## 2017-04-12 NOTE — Patient Instructions (Signed)
Tdap today   Shingles shot is optional and not highly recommended   Please remember to go to the lab and x-ray department downstairs in the basement  for your tests - we will call you with the results when they are available.    Return in one year if you haven't established with a primary provider by then in [facility] - call sooner if needed

## 2017-04-12 NOTE — Progress Notes (Signed)
Spoke with pt about results from cxr. Told no change was needed in recommendations made at [facility]. Pt expressed understanding. Nothing further needed at this time.

## 2017-04-12 NOTE — Progress Notes (Signed)
I notified pt of this at [facility]

## 2017-04-12 NOTE — Progress Notes (Signed)
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   HYPERCHOLESTEROLEMIA (ICD-272.0)  Target < 130 LDL   Problem # 2: AORTIC INSUFFICIENCY (ICD-424.1)   Problem # 3: RHINITIS, CHRONIC (ICD-472.0     04/12/2017  f/u ov/Emmanuella Mirante re:  Hyperlipidemia/ hbp / AI  Chief Complaint  Patient presents with  . Annual Exam    Pt is fasting. He states he is doing well and has no new co's today.    Tennis fine s sob/ cp/ tia/ claudication   No obvious day to day or daytime variability or assoc excess/ purulent sputum or mucus plugs or hemoptysis or chest tightness, subjective wheeze or overt sinus or hb symptoms. No unusual exp hx or h/o childhood pna/ asthma or knowledge of premature birth.  Sleeping ok without nocturnal  or early am exacerbation  of respiratory  c/o's or need for noct saba. Also denies any obvious fluctuation of symptoms with weather or environmental changes or other aggravating or alleviating factors except as outlined above   Current Medications, Allergies, Complete Past Medical History, Past Surgical History, Family History, and Social History were reviewed in [employer] record.  ROS  The following are not active complaints unless bolded sore throat, dysphagia, dental problems, itching, sneezing,  nasal congestion or excess/ purulent secretions, ear ache,   fever, chills, sweats, unintended wt loss, classically pleuritic or exertional cp,  orthopnea pnd or leg swelling, presyncope, palpitations, abdominal pain, anorexia, nausea, vomiting, diarrhea  or change in bowel or bladder habits, change in stools or urine, dysuria,hematuria,  rash, arthralgias, visual complaints, headache, numbness, weakness or ataxia or problems with walking or  coordination,  change in mood/affect or memory.                    Past Medical History:  RHINITIS, CHRONIC (ICD-472.0)  Hx of DIVERTICULITIS, COLON (ICD-562.11)............................Marland Kitchen Medoff  Hx of POLYP, COLON (ICD-211.3)  - Colonoscopy 05/2004, 12/2009  AORTIC INSUFFICIENCY (ICD-424.1)......................................... 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     Supple  Weight Gain  - ideal < 191 Target wt = 219 for BMI < 30  HEALTH MAINTENANCE.............................................................   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 Nl external ear canals without cough reflex  - moderate bilateral non-specific turbinate edema  With mp secretions Neck without JVD/Nodes/TM  Lungs clear to A and P bilaterally without cough on insp or exp maneuvers  RRR II-III/VI sem  No s3  Abd soft and benign with nl excursion in the supine position. No bruits or organomegaly  Ext warm without  calf tenderness, cyanosis clubbing or edema    MS nl gait Neuro: intact sensorium, no deficits Skin:  Multiple asym nevi  GU Testes down, no nodules or IH Rectal mod bph smooth no nodules, Stool G neg            CXR PA and Lateral:   04/12/2017 :    I personally reviewed images and agree with radiology impression as follows:    No active cardiopulmonary disease.   Labs ordered/ reviewed:      Chemistry      Component Value Date/Time   NA 139 04/12/2017 0944   K 4.5 04/12/2017 0944   CL 103 04/12/2017 0944   CO2 30 04/12/2017 0944   BUN 17 04/12/2017 0944   CREATININE 0.88 04/12/2017 0944      Component Value Date/Time   CALCIUM 9.5 04/12/2017 0944   ALKPHOS 60 04/12/2017 0944   AST 21 04/12/2017 0944   ALT 16 04/12/2017 0944   BILITOT 1.1 04/12/2017 0944        Lab Results  Component Value Date   WBC 7.2 04/12/2017   HGB 16.2 04/12/2017   HCT 47.1 04/12/2017   MCV 89.6 04/12/2017   PLT 193.0 04/12/2017        Lab Results  Component Value Date   TSH 1.49 04/12/2017                            Assessment:

## 2017-04-13 DIAGNOSIS — R972 Elevated prostate specific antigen [PSA]: Secondary | ICD-10-CM | POA: Insufficient documentation

## 2017-04-13 NOTE — Assessment & Plan Note (Signed)
-   Target LDL < 130 due to Type A, stress, ? Pos fm hx brothers   Lab Results  Component Value Date   CHOL 203 (H) 04/12/2017   HDL 54.90 04/12/2017   LDLCALC 128 (H) 04/12/2017   LDLDIRECT 167.3 04/09/2013   TRIG 98.0 04/12/2017   CHOLHDL 4 04/12/2017    Adequate control on present rx, reviewed in detail with pt > no change in rx needed  = diet/ ex

## 2017-04-13 NOTE — Assessment & Plan Note (Signed)
Adequate control on present rx, reviewed in detail with pt > no change in rx needed  Though does have a typical ACEi symptoms = sense of pnds > advised may need to change this to ARB if symptoms worsen

## 2017-04-13 NOTE — Assessment & Plan Note (Signed)
Lab Results  Component Value Date   PSA 5.34 (H) 04/12/2017   PSA 2.61 04/21/2016   PSA 3.04 04/12/2015     Exam is c/w mild/mod bph  but this is a significant change > refer to [employer]

## 2017-04-13 NOTE — Assessment & Plan Note (Signed)
Due Tdap > given Has had shingles/ ok to take shot if he wants to  Through his pharmacy though benefit is unclear in this setting in terms of preventing post -herpetic neuralgia, the most feared aspect of H Zoster/ reviewed  Will need PCP in [facility] when moves there

## 2017-04-19 ENCOUNTER — Other Ambulatory Visit: Payer: Self-pay

## 2017-04-19 ENCOUNTER — Ambulatory Visit (HOSPITAL_COMMUNITY): Payer: BLUE CROSS/BLUE SHIELD | Attending: Cardiology

## 2017-04-19 DIAGNOSIS — I350 Nonrheumatic aortic (valve) stenosis: Secondary | ICD-10-CM | POA: Diagnosis present

## 2017-04-23 ENCOUNTER — Telehealth: Payer: Self-pay

## 2017-04-23 NOTE — Telephone Encounter (Signed)
error 

## 2017-05-29 ENCOUNTER — Ambulatory Visit (INDEPENDENT_AMBULATORY_CARE_PROVIDER_SITE_OTHER): Payer: BLUE CROSS/BLUE SHIELD | Admitting: Internal Medicine

## 2017-05-29 ENCOUNTER — Encounter: Payer: Self-pay | Admitting: Internal Medicine

## 2017-05-29 VITALS — BP 124/78 | HR 64 | Ht 72.0 in | Wt 190.6 lb

## 2017-05-29 DIAGNOSIS — I359 Nonrheumatic aortic valve disorder, unspecified: Secondary | ICD-10-CM | POA: Diagnosis not present

## 2017-05-29 MED ORDER — AMOXICILLIN 500 MG PO CAPS: 500.0000 mg | ORAL_CAPSULE | ORAL | 3 refills | Status: AC

## 2017-05-29 NOTE — Patient Instructions (Signed)

## 2017-05-29 NOTE — Progress Notes (Signed)
HPI Leon Gibson returns today for ongoing followup of his aortic stenosis/regurgitation. He is a 67 yo man with melanoma, s/p resection, HTN and longstanding AI. He has normal LV function by echo. He has developed worsening aortic stenosis. A year ago his mean gradient was 18, and now it is 24. His aortic insufficiency remains moderate. His chamber dimensions are normal. The patient is active. He exercises 4 days a week and plays tennis on days that he does not exercise and has no limitation to very vigorous physical activity. He denies chest pain, shortness of breath, or syncope. No peripheral edema. His blood pressure is normal. He is considering moving down to [place]. No Known Allergies   Current Outpatient Prescriptions  Medication Sig Dispense Refill  . amoxicillin (AMOXIL) 500 MG capsule Take 1 capsule (500 mg total) by mouth as directed. Take 4 capsules by mouth 1 hour prior to dental procedures 4 capsule 3  . aspirin 81 MG tablet Take 81 mg by mouth daily.      . fluticasone (FLONASE) 50 MCG/ACT nasal spray SPRAY 1 SPRAY INTO THE NOSE TWICE A DAY 16 g 11  . Misc Natural Products (TART CHERRY ADVANCED PO) Take 3 each by mouth daily. Gummies    . Multiple Vitamin (MULTIVITAMIN) capsule Take 1 capsule by mouth daily.      . ramipril (ALTACE) 5 MG capsule TAKE 1 CAPSULE (5 MG TOTAL) BY MOUTH DAILY. 90 capsule 3  . tadalafil (CIALIS) 20 MG tablet Take 1 tablet (20 mg total) by mouth daily as needed for erectile dysfunction. 10 tablet 11   No current facility-administered medications for this visit.      Past Medical History:  Diagnosis Date  . Aortic valve disorders   . Chronic rhinitis   . Colon polyp   . Diverticulitis of colon (without mention of hemorrhage)(562.11)   . Healthcare maintenance   . Melanoma ([place])    L paralumbar 2003, no djuvant rx  . Palpitations   . Rheumatic heart disease   . Sinus bradycardia   . Weight gain     ROS:   All systems  reviewed and negative except as noted in the HPI.   Past Surgical History:  Procedure Laterality Date  . COLONOSCOPY     8/05 and 4/11  . HERNIA REPAIR Right    inguinal - as an infant   . melonoma removal     one level 2 melanoma with a single level 2 melanoma excised from his low back in aMay of 2003  . SHOULDER ARTHROSCOPY WITH SUBACROMIAL DECOMPRESSION AND BICEP TENDON REPAIR Right 01/28/2015   Procedure: RIGHT SHOULDER ARTHROSCOPY BICEP TENODESIS LABRAL DEBRIDEMENT SUBACROMIAL DECOMPRESSION  DISTAL CLAVICLE RESECTION ;  Surgeon: Justice Britain, MD;  Location: [place];  Service: Orthopedics;  Laterality: Right;  . TONSILLECTOMY       Family History  Problem Relation Age of Onset  . Depression Brother   . Hypertension Brother   . Colon polyps Mother   . COPD Mother   . Autism Brother   . Hypertension Brother   . Other Father      Social History   Social History  . Marital status: Married    Spouse name: N/A  . Number of children: N/A  . Years of education: N/A   Occupational History  . Not on file.   Social History Main Topics  . Smoking status: Former Smoker    Years: 1.00  . Smokeless tobacco:  Never Used     Comment: teenager only   . Alcohol use Yes     Comment: occ  . Drug use: No  . Sexual activity: Not on file   Other Topics Concern  . Not on file   Social History Narrative   Attorney.      BP 124/78   Pulse 64   Ht 6' (1.829 m)   Wt 190 lb 9.6 oz (86.5 kg)   SpO2 99%   BMI 25.85 kg/m   Physical Exam:  Well appearing 66 year old man, NAD HEENT: Unremarkable Neck:  7 cm JVD, no thyromegally Lymphatics:  No adenopathy Back:  No CVA tenderness Lungs:  Clear, with no wheezes, rales, or rhonchi. HEART:  Regular rate rhythm, grade 2/6 systolic, as well as diastolic murmurs, no rubs, no clicks Abd:  soft, positive bowel sounds, no organomegally, no rebound, no guarding Ext:  2 plus pulses, no edema, no cyanosis, no clubbing Skin:  No rashes no  nodules Neuro:  CN II through XII intact, motor grossly intact   Assess/Plan: 1. Aortic stenosis - the patient is asymptomatic. His valve has narrowed in the last year with an increase in his mean gradient. He does not yet have surgical disease, but may do well in the future. We discussed the indications for surgery. 2. Aortic insufficiency - this has not worsened in the past couple of years. He will undergo watchful waiting. Main question is whether or not his aortic insufficiency would preclude him from a TAVR. 3. Hypertension - his blood pressures have been normal. No change in medical therapy. His weight is good.  Leon Gibson, M.D.

## 2017-07-13 ENCOUNTER — Other Ambulatory Visit: Payer: Self-pay | Admitting: Internal Medicine

## 2017-09-20 ENCOUNTER — Telehealth: Payer: Self-pay | Admitting: Internal Medicine

## 2017-09-20 MED ORDER — HYDROCHLOROTHIAZIDE 25 MG PO TABS
25.0000 mg | ORAL_TABLET | Freq: Every day | ORAL | 5 refills | Status: DC
Start: 2017-09-20 — End: 2017-10-09

## 2017-09-20 NOTE — Telephone Encounter (Signed)
Called pt and advised message from the provider. Pt understood and verbalized understanding. Nothing further is needed.    Rx sent in. 

## 2017-09-20 NOTE — Telephone Encounter (Addendum)
Spoke with pt, he states he is on ramipril 5mg  and this has been controlling his BP. He noticed recently his blood pressure has been in the 150's. He is wondering if he should keep monitoring it or increase his ramipril? He is [place] at the end of January and wants to get this resolved before he leaves. Last 6 readings below. MW please advise.   151/80 145/71 152/75 150/80 157/75 154/71

## 2017-09-20 NOTE — Telephone Encounter (Signed)
lmtcb x1 for pt. 

## 2017-09-20 NOTE — Telephone Encounter (Signed)
The dose response curve for altace if flat meaning that more altace probably won't help and normal next step is hctz 25 mg daily - ok to give 6 m supply so he can have time to establish in [facility]

## 2017-09-20 NOTE — Telephone Encounter (Signed)
Pt returning call

## 2017-10-01 ENCOUNTER — Telehealth: Payer: Self-pay | Admitting: Internal Medicine

## 2017-10-01 NOTE — Telephone Encounter (Signed)
New Message     Pt c/o BP issue: STAT if pt c/o blurred vision, one-sided weakness or slurred speech  1. What are your last 5 BP readings?  140-150   2. Are you having any other symptoms (ex. Dizziness, headache, blurred vision, passed out)?  no  3. What is your BP issue? Over the last month is bp has increased, he feels his bp medication is not working anymore.

## 2017-10-01 NOTE — Telephone Encounter (Signed)
Returned call to Pt.  Per Pt his systolic BP reading has been high 130's to low 150's over last month.  Pt does admit to eating out more, as he is moving to [facility].  Pt had called PCP who had suggested pt start HCTZ.  Per Pt he does not want to take HCTZ d/t it's a diuretic and he already frequently urinates.  Pt denies chest pain, states "I don't feel any different".  Notified Pt Dr. Lovena Le on vacation, will check with Dr. Lovena Le on Friday and get his recommendations.  Pt indicates understanding.

## 2017-10-08 ENCOUNTER — Telehealth: Payer: Self-pay | Admitting: Internal Medicine

## 2017-10-08 NOTE — Telephone Encounter (Signed)
Laurier Nancy      10/08/17 9:39 AM  Note    New message    Patient calling to follow up on conversation from 12/31 regarding BP medication. Patient wants suggestion on BP medications. Does not want to take HCTZ. Please call      Returned call to Pt.  Notified Pt this nurse out sick last week, will discuss with Dr. Lovena Le 10/09/2017 and update Pt.  Pt indicates understanding.

## 2017-10-08 NOTE — Telephone Encounter (Signed)
Duplicate.  See 10/01/2017 phone note.

## 2017-10-08 NOTE — Telephone Encounter (Signed)
New message    Patient calling to follow up on conversation from 12/31 regarding BP medication. Patient wants suggestion on BP medications. Does not want to take HCTZ. Please call

## 2017-10-09 MED ORDER — METOPROLOL SUCCINATE ER 25 MG PO TB24: 25.0000 mg | ORAL_TABLET | Freq: Every day | ORAL | 3 refills | Status: DC

## 2017-10-09 NOTE — Telephone Encounter (Signed)
Call returned to Pt. Per Dr. Truddie Coco Toprol XL 25 mg daily PO.  Continue Ramipril 5 mg daily.  Pt does not need to take HCTZ (patient preference).  Pt indicates understanding.  Prescription entered.

## 2018-09-11 ENCOUNTER — Other Ambulatory Visit: Payer: Self-pay | Admitting: Internal Medicine

## 2018-10-03 ENCOUNTER — Other Ambulatory Visit: Payer: Self-pay | Admitting: Internal Medicine

## 2019-07-07 IMAGING — DX DG CHEST 2V
2 series · 2 of 2 positions shown · non-contrast
Comparison: 04/09/2014

CLINICAL DATA: Healthcare maintenance.  Aortic stenosis.

EXAM:
CHEST  2 VIEW

[chest pa]
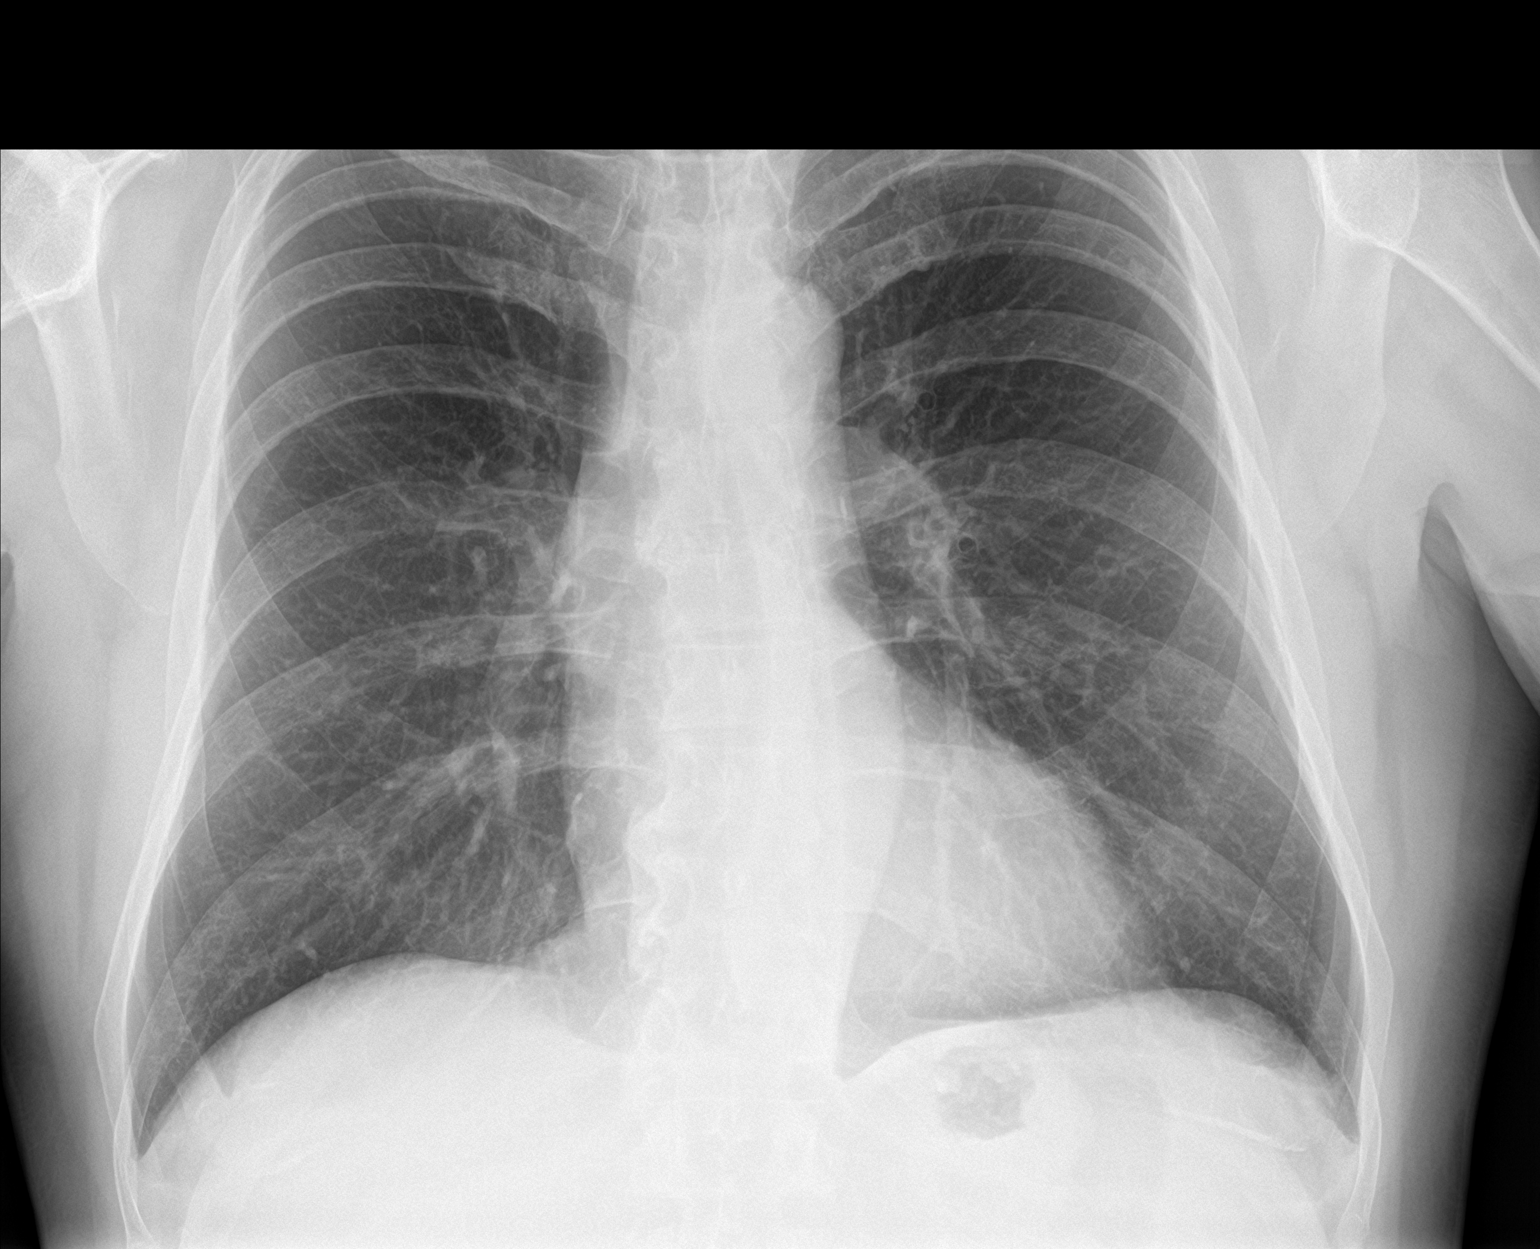

[chest lat]
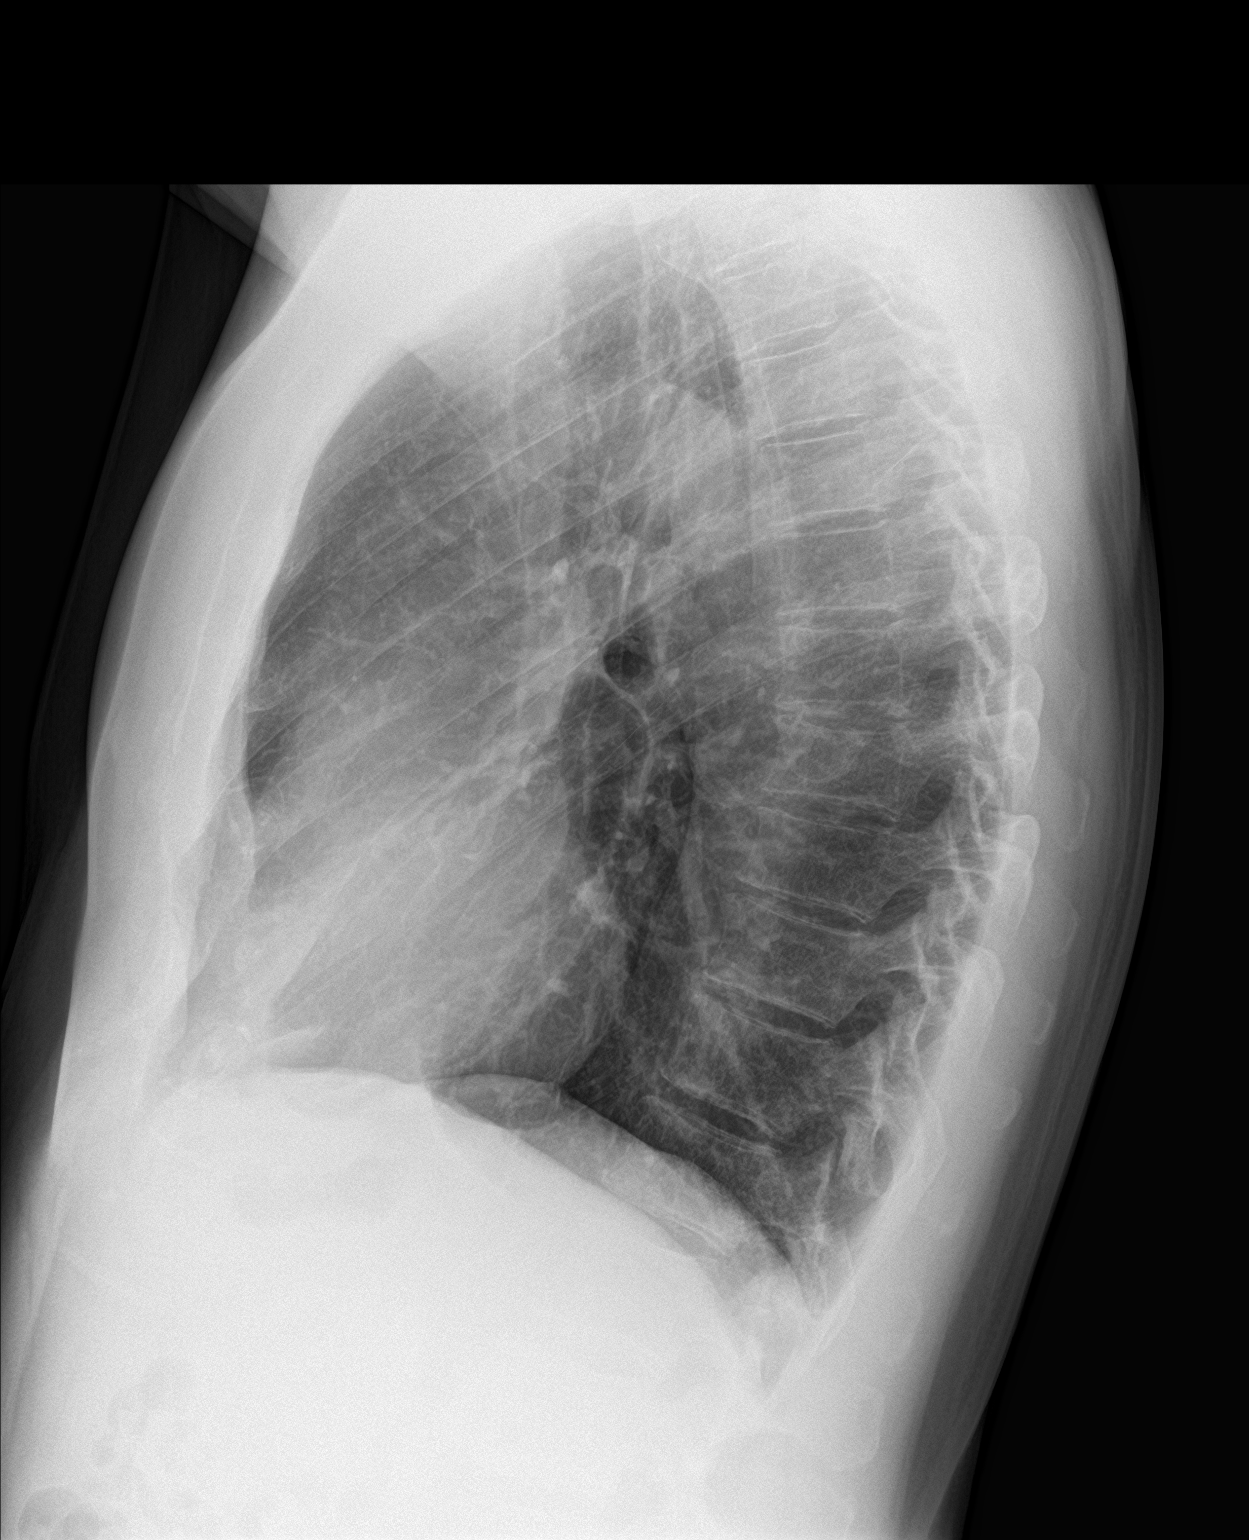

[2 of 2 positions shown; findings below may reference images not displayed]

FINDINGS: Heart and mediastinal contours are within normal limits. No focal
opacities or effusions. No acute bony abnormality.
IMPRESSION: No active cardiopulmonary disease.
